# Patient Record
Sex: Male | Born: 1965 | Race: White | Hispanic: No | Marital: Single | Smoking: Current every day smoker
Health system: Southern US, Community
[De-identification: ages and names within clinical notes are randomized; demographics above are authoritative.]

## PROBLEM LIST (undated history)

## (undated) DIAGNOSIS — E119 Type 2 diabetes mellitus without complications: Secondary | ICD-10-CM

## (undated) DIAGNOSIS — I10 Essential (primary) hypertension: Secondary | ICD-10-CM

---

## 2020-11-17 ENCOUNTER — Telehealth: Payer: Self-pay

## 2020-11-17 NOTE — Telephone Encounter (Signed)
Referral received for esophageal cancer and EUS. Pathology requested from [facility]. Contacted Mr. Paras and appointment arranged for 11/19/2020 at 0915. Prior imaging includes barium swallow on 10/21/2020 in [place], ordered by his PCP. PET scan has not been performed. No availability for EUS in [place] until 06/16. Will need this to be performed at his location of choice.  Lives in [place] and is here visiting with his daughter.

## 2020-11-19 ENCOUNTER — Inpatient Hospital Stay: Payer: BLUE CROSS/BLUE SHIELD | Attending: Oncology | Admitting: Oncology

## 2020-11-19 ENCOUNTER — Encounter (INDEPENDENT_AMBULATORY_CARE_PROVIDER_SITE_OTHER): Payer: Self-pay

## 2020-11-19 ENCOUNTER — Encounter: Payer: Self-pay | Admitting: Gastroenterology

## 2020-11-19 ENCOUNTER — Inpatient Hospital Stay: Payer: BLUE CROSS/BLUE SHIELD

## 2020-11-19 ENCOUNTER — Other Ambulatory Visit: Payer: Self-pay

## 2020-11-19 VITALS — BP 130/88 | HR 81 | Temp 97.9°F | Resp 18 | Ht 69.0 in | Wt 215.7 lb

## 2020-11-19 DIAGNOSIS — C159 Malignant neoplasm of esophagus, unspecified: Secondary | ICD-10-CM | POA: Diagnosis present

## 2020-11-19 NOTE — Progress Notes (Signed)
Met with Nicholas Leblanc and his daughter, [name]. PET has been scheduled for 12/01/20. Referral sent to [facility] for EUS. No availability in [place] currently. They will contact him for scheduling. Educated further on EUS.

## 2020-11-19 NOTE — Progress Notes (Signed)
[place]  [phone] [phone]  ID: Nicholas Leblanc OB: Nov 11, 1965  MR#: [id number]  PVV#:[id number]  Patient Care Team: Nicholas Rubenstein, MD as PCP - General (Gastroenterology) Nicholas Jacks, RN as Oncology Nurse Navigator  CHIEF COMPLAINT: Esophageal adenocarcinoma.  INTERVAL HISTORY: Patient is a 55 year old male with a past several months noted increasing difficulty swallowing as well as weight loss and epigastric pain.  He came to [place] from [place] to visit his daughter stating it was too long timeframe to get medical care where he lived.  He otherwise feels well.  He has no neurologic complaints.  He denies any recent fevers or illnesses.  He denies any chest pain, shortness of breath, cough, or hemoptysis.  He denies any nausea, vomiting, constipation, or diarrhea.  He has no urinary complaints.  Patient offers no further specific complaints today.  REVIEW OF SYSTEMS:   Review of Systems  Constitutional: Positive for weight loss. Negative for fever and malaise/fatigue.  Respiratory: Negative.  Negative for cough, hemoptysis and shortness of breath.   Cardiovascular: Negative.  Negative for chest pain and leg swelling.  Gastrointestinal: Positive for abdominal pain. Negative for heartburn and nausea.  Genitourinary: Negative.  Negative for dysuria.  Musculoskeletal: Negative.  Negative for back pain.  Skin: Negative.  Negative for rash.  Neurological: Negative.  Negative for dizziness, focal weakness, weakness and headaches.  Psychiatric/Behavioral: Negative.  The patient is not nervous/anxious.     As per HPI. Otherwise, a complete review of systems is negative.  PAST MEDICAL HISTORY: No past medical history on file.  PAST SURGICAL HISTORY: No past surgical history on file.  FAMILY HISTORY: No family history on file.  ADVANCED DIRECTIVES (Y/N):  N  HEALTH MAINTENANCE:     Colonoscopy:  PAP:  Bone  density:  Lipid panel:  Not on File  Current Outpatient Medications  Medication Sig Dispense Refill  . ibuprofen (ADVIL) 800 MG tablet Take 800 mg by mouth every 8 (eight) hours as needed.    . metFORMIN (GLUCOPHAGE) 500 MG tablet Take 500 mg by mouth 2 (two) times daily with a meal.    . rOPINIRole (REQUIP) 0.5 MG tablet Take 0.5 mg by mouth 2 (two) times daily.    . simvastatin (ZOCOR) 10 MG tablet Take 10 mg by mouth daily.    . lansoprazole (PREVACID) 15 MG capsule Take 15 mg by mouth.    Marland Kitchen lisinopril (ZESTRIL) 5 MG tablet Take 5 mg by mouth daily.     No current facility-administered medications for this visit.    OBJECTIVE: Vitals:   11/19/20 0919  BP: 130/88  Pulse: 81  Resp: 18  Temp: 97.9 F (36.6 C)  SpO2: 100%     Body mass index is 31.85 kg/m.    ECOG FS:0 - Asymptomatic  General: Well-developed, well-nourished, no acute distress. Eyes: Pink conjunctiva, anicteric sclera. HEENT: Normocephalic, moist mucous membranes. Lungs: No audible wheezing or coughing. Heart: Regular rate and rhythm. Abdomen: Soft, nontender, no obvious distention. Musculoskeletal: No edema, cyanosis, or clubbing. Neuro: Alert, answering all questions appropriately. Cranial nerves grossly intact. Skin: No rashes or petechiae noted. Psych: Normal affect. Lymphatics: No cervical, calvicular, axillary or inguinal LAD.   LAB RESULTS:  No results found for: NA, K, CL, CO2, GLUCOSE, BUN, CREATININE, CALCIUM, PROT, ALBUMIN, AST, ALT, ALKPHOS, BILITOT, GFRNONAA, GFRAA  No results found for: WBC, NEUTROABS, HGB, HCT, MCV, PLT   STUDIES: No results found.  ASSESSMENT: Esophageal adenocarcinoma.  PLAN:  1. Esophageal adenocarcinoma: By report patient had EGD with biopsy confirming adenocarcinoma, but pathology results have not been reviewed.  He will require EUS and PET scan to complete the staging work-up.  Patient will follow-up 4 to 5 days after his EUS to discuss his results and  treatment planning.  I spent a total of 60 minutes reviewing chart data, face-to-face evaluation with the patient, counseling and coordination of care as detailed above.  Patient expressed understanding and was in agreement with this plan. He also understands that He can call clinic at any time with any questions, concerns, or complaints.   Cancer Staging No matching staging information was found for the patient.  Nicholas Huger, MD   11/19/2020 1:45 PM

## 2020-11-19 NOTE — Progress Notes (Signed)
Patient is here from [place] having sore chest pains and can not swallowing since the beginning of this year. Only eat like jello and apple sauce .  Tumor is blocking throat .

## 2020-11-20 ENCOUNTER — Other Ambulatory Visit: Payer: Self-pay | Admitting: *Deleted

## 2020-11-20 ENCOUNTER — Telehealth: Payer: Self-pay

## 2020-11-20 DIAGNOSIS — C159 Malignant neoplasm of esophagus, unspecified: Secondary | ICD-10-CM

## 2020-11-20 NOTE — Telephone Encounter (Signed)
Called and notified daughter, [name], with appointments 6/14 starting at 0930 with Dr. Baruch Gouty. He will see Dr. Grayland Ormond following at 1015.

## 2020-11-26 ENCOUNTER — Other Ambulatory Visit: Payer: Self-pay | Admitting: *Deleted

## 2020-11-26 DIAGNOSIS — C159 Malignant neoplasm of esophagus, unspecified: Secondary | ICD-10-CM

## 2020-11-27 ENCOUNTER — Telehealth: Payer: Self-pay

## 2020-11-27 NOTE — Telephone Encounter (Signed)
CT scan authorized and rescheduled for 12/01/20 at 0830 with [place] arrival. Called and updated daughter, [name]. Reviewed instructions as written. Directions given to the [facility].

## 2020-12-01 ENCOUNTER — Ambulatory Visit
Admission: RE | Admit: 2020-12-01 | Discharge: 2020-12-01 | Disposition: A | Discharge status: Home / Self Care | Payer: BLUE CROSS/BLUE SHIELD | Source: Ambulatory Visit | Attending: Oncology | Admitting: Oncology

## 2020-12-01 ENCOUNTER — Other Ambulatory Visit: Payer: Self-pay

## 2020-12-01 ENCOUNTER — Ambulatory Visit: Payer: BLUE CROSS/BLUE SHIELD

## 2020-12-01 DIAGNOSIS — C159 Malignant neoplasm of esophagus, unspecified: Secondary | ICD-10-CM | POA: Diagnosis not present

## 2020-12-01 HISTORY — DX: Type 2 diabetes mellitus without complications: E11.9

## 2020-12-01 HISTORY — DX: Essential (primary) hypertension: I10

## 2020-12-01 LAB — POCT I-STAT CREATININE: Creatinine, Ser: 0.8 mg/dL (ref 0.61–1.24)

## 2020-12-01 MED ORDER — IOHEXOL 300 MG/ML  SOLN
100.0000 mL | Freq: Once | INTRAMUSCULAR | Status: AC | PRN
  Administered 2020-12-01: 100 mL via INTRAVENOUS

## 2020-12-03 ENCOUNTER — Encounter: Payer: Self-pay | Admitting: Oncology

## 2020-12-03 ENCOUNTER — Ambulatory Visit: Payer: BLUE CROSS/BLUE SHIELD

## 2020-12-04 DIAGNOSIS — C159 Malignant neoplasm of esophagus, unspecified: Secondary | ICD-10-CM | POA: Insufficient documentation

## 2020-12-04 NOTE — Progress Notes (Signed)
[place]  [phone]) [phone] Fax:(336) [phone]  ID: Nicholas Leblanc OB: 06-Jan-1966  MR#: [id number]  NLZ#:[id number]  Patient Care Team: Lesly Rubenstein, MD as PCP - General (Gastroenterology) Clent Jacks, RN as Oncology Nurse Navigator  CHIEF COMPLAINT: Stage III esophageal adenocarcinoma.  INTERVAL HISTORY: Patient returns to clinic today for further evaluation, discussion of his pathology and imaging results, and treatment planning.  He continues to have difficulty swallowing, but otherwise feels well. He has no neurologic complaints.  He denies any recent fevers or illnesses.  He denies any chest pain, shortness of breath, cough, or hemoptysis.  He denies any nausea, vomiting, constipation, or diarrhea.  He has no urinary complaints.  Patient offers no further specific complaints today.  REVIEW OF SYSTEMS:   Review of Systems  Constitutional:  Positive for weight loss. Negative for fever and malaise/fatigue.  Respiratory: Negative.  Negative for cough, hemoptysis and shortness of breath.   Cardiovascular: Negative.  Negative for chest pain and leg swelling.  Gastrointestinal:  Negative for abdominal pain, heartburn and nausea.  Genitourinary: Negative.  Negative for dysuria.  Musculoskeletal:  Positive for back pain.  Skin: Negative.  Negative for rash.  Neurological: Negative.  Negative for dizziness, focal weakness, weakness and headaches.  Psychiatric/Behavioral: Negative.  The patient is not nervous/anxious.    As per HPI. Otherwise, a complete review of systems is negative.  PAST MEDICAL HISTORY: Past Medical History:  Diagnosis Date   Diabetes mellitus without complication ([place])    Hypertension     PAST SURGICAL HISTORY: No past surgical history on file.  FAMILY HISTORY: History reviewed. No pertinent family history.  ADVANCED DIRECTIVES (Y/N):  N  HEALTH MAINTENANCE: Social History   Tobacco Use   Smoking status: Every Day     Packs/day: 1.00    Pack years: 0.00    Types: Cigarettes   Smokeless tobacco: Never  Vaping Use   Vaping Use: Every day  Substance Use Topics   Alcohol use: Never   Drug use: Yes    Types: Marijuana     Colonoscopy:  PAP:  Bone density:  Lipid panel:  Allergies  Allergen Reactions   Bee Venom Swelling    Doesn't require Epi-Pen    Current Outpatient Medications  Medication Sig Dispense Refill   ibuprofen (ADVIL) 800 MG tablet Take 800 mg by mouth every 8 (eight) hours as needed.     lisinopril (ZESTRIL) 5 MG tablet Take 5 mg by mouth daily.     metFORMIN (GLUCOPHAGE) 500 MG tablet Take 500 mg by mouth 2 (two) times daily with a meal.     omeprazole (PRILOSEC) 40 MG capsule Take 1 capsule by mouth daily.     simvastatin (ZOCOR) 10 MG tablet Take 10 mg by mouth daily.     ondansetron (ZOFRAN) 8 MG tablet Take 1 tablet (8 mg total) by mouth 2 (two) times daily as needed for refractory nausea / vomiting. Start on day 3 after chemo. 60 tablet 1   No current facility-administered medications for this visit.    OBJECTIVE: There were no vitals filed for this visit.    There is no height or weight on file to calculate BMI.    ECOG FS:0 - Asymptomatic  General: Well-developed, well-nourished, no acute distress. Eyes: Pink conjunctiva, anicteric sclera. HEENT: Normocephalic, moist mucous membranes. Lungs: No audible wheezing or coughing. Heart: Regular rate and rhythm. Abdomen: Soft, nontender, no obvious distention. Musculoskeletal: No edema, cyanosis, or clubbing. Neuro: Alert, answering all  questions appropriately. Cranial nerves grossly intact. Skin: No rashes or petechiae noted. Psych: Normal affect.   LAB RESULTS:  Lab Results  Component Value Date   CREATININE 0.80 12/01/2020    No results found for: WBC, NEUTROABS, HGB, HCT, MCV, PLT   STUDIES: CT CHEST ABDOMEN PELVIS W CONTRAST  Result Date: 12/01/2020 CLINICAL DATA:  Esophageal cancer. Worsening  dysphagia with intermittent nausea and vomiting. Current smoker. EXAM: CT CHEST, ABDOMEN, AND PELVIS WITH CONTRAST TECHNIQUE: Multidetector CT imaging of the chest, abdomen and pelvis was performed following the standard protocol during bolus administration of intravenous contrast. CONTRAST:  178mL OMNIPAQUE IOHEXOL 300 MG/ML  SOLN COMPARISON:  None. FINDINGS: CT CHEST FINDINGS Cardiovascular: Heart is at the upper limits of normal in size. No pericardial effusion. Mediastinum/Nodes: Mid periesophageal lymph node measures 6 mm (2/29). 8 mm short axis distal periesophageal lymph node is seen adjacent to masslike thickening of the distal esophagus and gastroesophageal junction. No additional pathologically enlarged mediastinal, hilar or axillary lymph nodes. Lungs/Pleura: Smoking related respiratory bronchiolitis. 3 mm anterior segment central right upper lobe nodule (3/77). Scarring in the lingula. Lungs are otherwise clear. No pleural fluid. Airway is unremarkable. Musculoskeletal: Old posterior right rib fracture. Degenerative changes in the spine. No worrisome lytic or sclerotic lesions. CT ABDOMEN PELVIS FINDINGS Hepatobiliary: Liver is slightly decreased in attenuation diffusely. Liver and gallbladder are otherwise unremarkable. No biliary ductal dilatation. Pancreas: Negative. Spleen: Negative. Adrenals/Urinary Tract: Adrenal glands and right kidney are unremarkable. Probable small renal sinus cysts on the left. Ureters are decompressed. Slight bladder wall thickening. Stomach/Bowel: Masslike thickening at the gastroesophageal junction. Stomach, small bowel, appendix and colon are otherwise unremarkable. Vascular/Lymphatic: Vascular structures are unremarkable. Gastrohepatic ligament lymph nodes measure up to 12 mm (2/54). Left periaortic lymph node measures 6 mm (2/64). Reproductive: Prostate is visualized. Other: No free fluid. Mesenteries and peritoneum are otherwise unremarkable. Musculoskeletal:  Degenerative changes in the spine. No worrisome lytic or sclerotic lesions. IMPRESSION: 1. Masslike gastroesophageal and distal esophageal wall thickening, compatible with the provided history of esophageal carcinoma. Small to mildly enlarged periesophageal, gastrohepatic ligament and high left periaortic lymph nodes. No evidence of distant metastatic disease. 2. 3 mm right upper lobe nodule, nonspecific. Recommend attention on follow-up. 3. Hepatic steatosis. Electronically Signed   By: Lorin Picket M.D.   On: 12/01/2020 12:31    ASSESSMENT: Stage III esophageal adenocarcinoma.  PLAN:    1. Stage III esophageal adenocarcinoma: EGD and biopsy completed in [place] confirms adenocarcinoma.  Recent EUS and CT scan confirmed stage of disease.  He had consultation with radiation oncology earlier today.  Patient will benefit from neoadjuvant chemotherapy with weekly carboplatinum and Taxol along with daily XRT.  Rad Julio Alm has ordered PET scan for treatment planning purposes.  Patient also wishes to go back to [place] for 1 to 2 weeks prior to initiating treatment.  Return to clinic in 2 to 3 weeks for further evaluation and consideration of cycle 1 of weekly carboplatinum and Taxol and daily XRT.   2.  Back pain: Chronic.  Patient plans to discuss treatment options with his primary care physician in [place].  I spent a total of 30 minutes reviewing chart data, face-to-face evaluation with the patient, counseling and coordination of care as detailed above.  Patient expressed understanding and was in agreement with this plan. He also understands that He can call clinic at any time with any questions, concerns, or complaints.   Cancer Staging Esophageal adenocarcinoma [facility]) Staging form: Esophagus -  Adenocarcinoma, AJCC 8th Edition - Clinical stage from 12/04/2020: Stage III (cT3, cN1, cM0) - Signed by [name], MD on 12/04/2020 Stage prefix: Initial diagnosis  [name], MD    12/08/2020 2:03 PM

## 2020-12-08 ENCOUNTER — Encounter: Payer: Self-pay | Admitting: Radiation Oncology

## 2020-12-08 ENCOUNTER — Inpatient Hospital Stay: Payer: BLUE CROSS/BLUE SHIELD | Attending: Oncology | Admitting: Oncology

## 2020-12-08 ENCOUNTER — Encounter: Payer: Self-pay | Admitting: Oncology

## 2020-12-08 ENCOUNTER — Ambulatory Visit
Admission: RE | Admit: 2020-12-08 | Discharge: 2020-12-08 | Disposition: A | Discharge status: Home / Self Care | Payer: BLUE CROSS/BLUE SHIELD | Source: Ambulatory Visit | Attending: Radiation Oncology | Admitting: Radiation Oncology

## 2020-12-08 VITALS — BP 146/87 | HR 98 | Temp 96.1°F | Resp 18 | Wt 201.8 lb

## 2020-12-08 DIAGNOSIS — G8929 Other chronic pain: Secondary | ICD-10-CM | POA: Insufficient documentation

## 2020-12-08 DIAGNOSIS — M549 Dorsalgia, unspecified: Secondary | ICD-10-CM | POA: Insufficient documentation

## 2020-12-08 DIAGNOSIS — C155 Malignant neoplasm of lower third of esophagus: Secondary | ICD-10-CM | POA: Diagnosis present

## 2020-12-08 DIAGNOSIS — C159 Malignant neoplasm of esophagus, unspecified: Secondary | ICD-10-CM | POA: Diagnosis present

## 2020-12-08 DIAGNOSIS — F17210 Nicotine dependence, cigarettes, uncomplicated: Secondary | ICD-10-CM | POA: Insufficient documentation

## 2020-12-08 DIAGNOSIS — Z79899 Other long term (current) drug therapy: Secondary | ICD-10-CM | POA: Diagnosis not present

## 2020-12-08 DIAGNOSIS — Z7984 Long term (current) use of oral hypoglycemic drugs: Secondary | ICD-10-CM | POA: Insufficient documentation

## 2020-12-08 DIAGNOSIS — I10 Essential (primary) hypertension: Secondary | ICD-10-CM | POA: Diagnosis not present

## 2020-12-08 DIAGNOSIS — E119 Type 2 diabetes mellitus without complications: Secondary | ICD-10-CM | POA: Diagnosis not present

## 2020-12-08 DIAGNOSIS — R634 Abnormal weight loss: Secondary | ICD-10-CM | POA: Diagnosis not present

## 2020-12-08 MED ORDER — ONDANSETRON HCL 8 MG PO TABS
8.0000 mg | ORAL_TABLET | Freq: Two times a day (BID) | ORAL | 1 refills | Status: DC | PRN
Start: 2020-12-08 — End: 2024-03-04

## 2020-12-08 NOTE — Progress Notes (Signed)
Pt seen in radiation oncology by Dr Baruch Gouty. Vital signs obtained and pt medical history reviewed in radiation clinic.

## 2020-12-08 NOTE — Consult Note (Signed)
NEW PATIENT EVALUATION  Name: Nicholas Leblanc  MRN: [id number]  Date:   12/08/2020     DOB: 28-Jun-1965   This 55 y.o. male patient presents to the clinic for initial evaluation of locally advanced adenocarcinoma the distal esophagus pending PET/CT for complete staging.  REFERRING PHYSICIAN: Lesly Rubenstein, MD  CHIEF COMPLAINT:  Chief Complaint  Patient presents with   Esophageal Cancer    Initial consultation    DIAGNOSIS: The encounter diagnosis was Esophageal adenocarcinoma ([place]).   PREVIOUS INVESTIGATIONS:  CT scan reviewed Pathology report reviewed Clinical notes reviewed  HPI: Patient is a 55 year old male with an 80 pound weight loss since January and increasing dysphagia.  He is from [place] was down here [place].  He underwent upper and endoscopy showing a mass in the distal esophagus biopsy positive for moderately differentiated adenocarcinoma.  He had a CT scan performed showing masslike GE junction and distal esophageal wall thickening compatible with history of esophageal carcinoma.  He also has small to mildly enlarged periesophageal gastrohepatic ligament and high left periaortic lymph nodes.  No evidence of distant metastatic disease.  He has PET scan pending.  He has been seen by medical oncology is now referred to radiation oncology for consideration of treatment.  Continues to have some dysphagia.  Also has chronic back pain.  PLANNED TREATMENT REGIMEN: Concurrent chemoradiation in neoadjuvant fashion in anticipation of possible surgical resection  PAST MEDICAL HISTORY:  has a past medical history of Diabetes mellitus without complication ([place]) and Hypertension.    PAST SURGICAL HISTORY: History reviewed. No pertinent surgical history.  FAMILY HISTORY: family history is not on file.  SOCIAL HISTORY:  reports that he has been smoking cigarettes. He has been smoking an average of 1.00 packs per day. He has never used smokeless tobacco. He reports  current drug use. Drug: Marijuana. He reports that he does not drink alcohol.  ALLERGIES: Bee venom  MEDICATIONS:  Current Outpatient Medications  Medication Sig Dispense Refill   ibuprofen (ADVIL) 800 MG tablet Take 800 mg by mouth every 8 (eight) hours as needed.     lisinopril (ZESTRIL) 5 MG tablet Take 5 mg by mouth daily.     metFORMIN (GLUCOPHAGE) 500 MG tablet Take 500 mg by mouth 2 (two) times daily with a meal.     omeprazole (PRILOSEC) 40 MG capsule Take 1 capsule by mouth daily.     ondansetron (ZOFRAN) 8 MG tablet Take 1 tablet (8 mg total) by mouth 2 (two) times daily as needed for refractory nausea / vomiting. Start on day 3 after chemo. 60 tablet 1   simvastatin (ZOCOR) 10 MG tablet Take 10 mg by mouth daily.     No current facility-administered medications for this encounter.    ECOG PERFORMANCE STATUS:  1 - Symptomatic but completely ambulatory  REVIEW OF SYSTEMS: Patient denies any weight loss, fatigue, weakness, fever, chills or night sweats. Patient denies any loss of vision, blurred vision. Patient denies any ringing  of the ears or hearing loss. No irregular heartbeat. Patient denies heart murmur or history of fainting. Patient denies any chest pain or pain radiating to her upper extremities. Patient denies any shortness of breath, difficulty breathing at night, cough or hemoptysis. Patient denies any swelling in the lower legs. Patient denies any nausea vomiting, vomiting of blood, or coffee ground material in the vomitus. Patient denies any stomach pain. Patient states has had normal bowel movements no significant constipation or diarrhea. Patient denies any dysuria, hematuria  or significant nocturia. Patient denies any problems walking, swelling in the joints or loss of balance. Patient denies any skin changes, loss of hair or loss of weight. Patient denies any excessive worrying or anxiety or significant depression. Patient denies any problems with insomnia. Patient  denies excessive thirst, polyuria, polydipsia. Patient denies any swollen glands, patient denies easy bruising or easy bleeding. Patient denies any recent infections, allergies or URI. Patient "s visual fields have not changed significantly in recent time.   PHYSICAL EXAM: BP (!) 146/87 (BP Location: Left Arm, Patient Position: Sitting)   Pulse 98   Temp (!) 96.1 F (35.6 C) (Tympanic)   Resp 18   Wt 201 lb 12.8 oz (91.5 kg)   BMI 29.80 kg/m  Well-developed well-nourished patient in NAD. HEENT reveals PERLA, EOMI, discs not visualized.  Oral cavity is clear. No oral mucosal lesions are identified. Neck is clear without evidence of cervical or supraclavicular adenopathy. Lungs are clear to A&P. Cardiac examination is essentially unremarkable with regular rate and rhythm without murmur rub or thrill. Abdomen is benign with no organomegaly or masses noted. Motor sensory and DTR levels are equal and symmetric in the upper and lower extremities. Cranial nerves II through XII are grossly intact. Proprioception is intact. No peripheral adenopathy or edema is identified. No motor or sensory levels are noted. Crude visual fields are within normal range.  LABORATORY DATA: Pathology report reviewed    RADIOLOGY RESULTS: CT scans chest and abdomen reviewed compatible with above-stated findings   IMPRESSION: Locally advanced adenocarcinoma the distal esophagus in 55 year old male  PLAN: This time I am waiting the PET/CT results for complete staging.  I would plan on concurrent chemoradiation therapy in a neoadjuvant fashion to his distal esophagus.  I would use IMRT treatment planning and delivery to spare critical structures such as his spinal cord stomach heart and lungs.  Risks and benefits of treatment including increased dysphagia polyps possible skin reaction alteration of blood counts and fatigue all were reviewed with the patient and his daughter.  They both seem to comprehend my treatment plan well.   I will schedule CT simulation when we have final definitive scheduling of his PET CT scan.  There will be extra effort by both professional staff as well as technical staff to coordinate and manage concurrent chemoradiation and ensuing side effects during his treatments.   I would like to take this opportunity to thank you for allowing me to participate in the care of your patient.Noreene Filbert, MD

## 2020-12-08 NOTE — Progress Notes (Signed)
START ON PATHWAY REGIMEN - Gastroesophageal     Administer weekly during RT:     Paclitaxel      Carboplatin   **Always confirm dose/schedule in your pharmacy ordering system**  Patient Characteristics: Esophageal & GE Junction, Adenocarcinoma, Preoperative or Nonsurgical Candidate (Clinical Staging), cT2 or Higher or cN+, Surgical Candidate (Up to cT4a) - Preoperative Therapy, Esophageal Histology: Adenocarcinoma Disease Classification: Esophageal Therapeutic Status: Preoperative or Nonsurgical Candidate (Clinical Staging) AJCC Grade: GX AJCC 8 Stage Grouping: III AJCC T Category: cT3 AJCC N Category: cN1 AJCC M Category: cM0 Intent of Therapy: Curative Intent, Discussed with Patient

## 2020-12-09 ENCOUNTER — Ambulatory Visit: Payer: BLUE CROSS/BLUE SHIELD

## 2020-12-10 ENCOUNTER — Other Ambulatory Visit: Payer: BLUE CROSS/BLUE SHIELD

## 2020-12-11 ENCOUNTER — Other Ambulatory Visit: Payer: Self-pay | Admitting: Pharmacist

## 2020-12-29 ENCOUNTER — Inpatient Hospital Stay: Payer: BLUE CROSS/BLUE SHIELD

## 2020-12-31 ENCOUNTER — Ambulatory Visit
Admission: RE | Admit: 2020-12-31 | Discharge: 2020-12-31 | Disposition: A | Discharge status: Home / Self Care | Payer: BLUE CROSS/BLUE SHIELD | Source: Ambulatory Visit | Attending: Oncology | Admitting: Oncology

## 2020-12-31 ENCOUNTER — Other Ambulatory Visit: Payer: Self-pay

## 2020-12-31 DIAGNOSIS — C159 Malignant neoplasm of esophagus, unspecified: Secondary | ICD-10-CM | POA: Diagnosis present

## 2020-12-31 LAB — GLUCOSE, CAPILLARY: Glucose-Capillary: 150 mg/dL — ABNORMAL HIGH (ref 70–99)

## 2020-12-31 MED ORDER — FLUDEOXYGLUCOSE F - 18 (FDG) INJECTION
9.5000 | Freq: Once | INTRAVENOUS | Status: AC | PRN
  Administered 2020-12-31: 10.03 via INTRAVENOUS

## 2021-01-01 ENCOUNTER — Inpatient Hospital Stay: Payer: BLUE CROSS/BLUE SHIELD | Attending: Oncology

## 2021-01-01 DIAGNOSIS — Z5111 Encounter for antineoplastic chemotherapy: Secondary | ICD-10-CM | POA: Insufficient documentation

## 2021-01-01 DIAGNOSIS — M549 Dorsalgia, unspecified: Secondary | ICD-10-CM | POA: Insufficient documentation

## 2021-01-01 DIAGNOSIS — C159 Malignant neoplasm of esophagus, unspecified: Secondary | ICD-10-CM | POA: Insufficient documentation

## 2021-01-01 DIAGNOSIS — R634 Abnormal weight loss: Secondary | ICD-10-CM | POA: Insufficient documentation

## 2021-01-01 DIAGNOSIS — G8929 Other chronic pain: Secondary | ICD-10-CM | POA: Insufficient documentation

## 2021-01-01 DIAGNOSIS — Z79899 Other long term (current) drug therapy: Secondary | ICD-10-CM | POA: Insufficient documentation

## 2021-01-04 ENCOUNTER — Ambulatory Visit
Admission: RE | Admit: 2021-01-04 | Discharge: 2021-01-04 | Disposition: A | Discharge status: Home / Self Care | Payer: BLUE CROSS/BLUE SHIELD | Source: Ambulatory Visit | Attending: Radiation Oncology | Admitting: Radiation Oncology

## 2021-01-04 DIAGNOSIS — C155 Malignant neoplasm of lower third of esophagus: Secondary | ICD-10-CM | POA: Diagnosis present

## 2021-01-04 DIAGNOSIS — Z510 Encounter for antineoplastic radiation therapy: Secondary | ICD-10-CM | POA: Diagnosis present

## 2021-01-06 ENCOUNTER — Telehealth: Payer: Self-pay

## 2021-01-06 NOTE — Telephone Encounter (Signed)
Patient is scheduled to start radiation on 7/19.  Please schedule lab/Finn/Carbo/Taxol (weekly) *NEW* no later than 7/21.

## 2021-01-06 NOTE — Progress Notes (Deleted)
Pharmacist Chemotherapy Monitoring - Initial Assessment    Anticipated start date: 01/13/21   The following has been reviewed per standard work regarding the patient's treatment regimen: The patient's diagnosis, treatment plan and drug doses, and organ/hematologic function Lab orders and baseline tests specific to treatment regimen  The treatment plan start date, drug sequencing, and pre-medications Prior authorization status  Patient's documented medication list, including drug-drug interaction screen and prescriptions for anti-emetics and supportive care specific to the treatment regimen The drug concentrations, fluid compatibility, administration routes, and timing of the medications to be used The patient's access for treatment and lifetime cumulative dose history, if applicable  The patient's medication allergies and previous infusion related reactions, if applicable   Changes made to treatment plan:  treatment plan date  Follow up needed:  {MD follow up:25720}   Adelina Mings, RPH, 01/06/2021  1:22 PM

## 2021-01-10 DIAGNOSIS — C155 Malignant neoplasm of lower third of esophagus: Secondary | ICD-10-CM | POA: Diagnosis not present

## 2021-01-12 ENCOUNTER — Ambulatory Visit: Admission: RE | Admit: 2021-01-12 | Payer: BLUE CROSS/BLUE SHIELD | Source: Ambulatory Visit

## 2021-01-12 NOTE — Progress Notes (Signed)
[place]  [phone] [phone]) [phone]  ID: Nicholas Leblanc OB: 11-Aug-1965  MR#: [id number]  HFW#:[id number]  Patient Care Team: Lesly Rubenstein, MD as PCP - General (Gastroenterology) Clent Jacks, RN as Oncology Nurse Navigator  CHIEF COMPLAINT: Stage III esophageal adenocarcinoma.  INTERVAL HISTORY: Patient returns to clinic today for further evaluation and initiation of concurrent chemotherapy with weekly carboplatinum and Taxol along with his daily XRT.  He continues to have difficulty swallowing that is unchanged. He has no neurologic complaints.  He denies any recent fevers or illnesses.  He denies any chest pain, shortness of breath, cough, or hemoptysis.  He denies any nausea, vomiting, constipation, or diarrhea.  He has no urinary complaints.  Patient offers no further specific complaints today.  REVIEW OF SYSTEMS:   Review of Systems  Constitutional: Negative.  Negative for fever, malaise/fatigue and weight loss.  Respiratory: Negative.  Negative for cough, hemoptysis and shortness of breath.   Cardiovascular: Negative.  Negative for chest pain and leg swelling.  Gastrointestinal:  Negative for abdominal pain, heartburn and nausea.  Genitourinary: Negative.  Negative for dysuria.  Musculoskeletal:  Positive for back pain.  Skin: Negative.  Negative for rash.  Neurological: Negative.  Negative for dizziness, focal weakness, weakness and headaches.  Psychiatric/Behavioral: Negative.  The patient is not nervous/anxious.    As per HPI. Otherwise, a complete review of systems is negative.  PAST MEDICAL HISTORY: Past Medical History:  Diagnosis Date   Diabetes mellitus without complication ([place])    Hypertension     PAST SURGICAL HISTORY: No past surgical history on file.  FAMILY HISTORY: History reviewed. No pertinent family history.  ADVANCED DIRECTIVES (Y/N):  N  HEALTH MAINTENANCE: Social History   Tobacco Use    Smoking status: Every Day    Packs/day: 1.00    Types: Cigarettes   Smokeless tobacco: Never  Vaping Use   Vaping Use: Every day  Substance Use Topics   Alcohol use: Never   Drug use: Yes    Types: Marijuana     Colonoscopy:  PAP:  Bone density:  Lipid panel:  Allergies  Allergen Reactions   Bee Venom Swelling    Doesn't require Epi-Pen    Current Outpatient Medications  Medication Sig Dispense Refill   FLUoxetine (PROZAC) 20 MG capsule Take 20 mg by mouth daily.     HYDROmorphone (DILAUDID) 8 MG tablet Take by mouth.     ibuprofen (ADVIL) 800 MG tablet Take 800 mg by mouth every 8 (eight) hours as needed.     lisinopril (ZESTRIL) 5 MG tablet Take 5 mg by mouth daily.     metFORMIN (GLUCOPHAGE) 500 MG tablet Take 500 mg by mouth 2 (two) times daily with a meal.     omeprazole (PRILOSEC) 40 MG capsule Take 1 capsule by mouth daily.     ondansetron (ZOFRAN) 8 MG tablet Take 1 tablet (8 mg total) by mouth 2 (two) times daily as needed for refractory nausea / vomiting. Start on day 3 after chemo. 60 tablet 1   oxyCODONE-acetaminophen (PERCOCET) 10-325 MG tablet Take 1 tablet by mouth every 4 (four) hours as needed.     prochlorperazine (COMPAZINE) 10 MG tablet Take 1 tablet (10 mg total) by mouth every 6 (six) hours as needed for nausea or vomiting. 30 tablet 2   simvastatin (ZOCOR) 10 MG tablet Take 10 mg by mouth daily.     No current facility-administered medications for this visit.    OBJECTIVE:  Vitals:   01/13/21 0851  BP: 113/87  Pulse: 97  Resp: 18  Temp: (!) 96 F (35.6 C)  SpO2: 100%      Body mass index is 26.43 kg/m.    ECOG FS:0 - Asymptomatic  General: Well-developed, well-nourished, no acute distress. Eyes: Pink conjunctiva, anicteric sclera. HEENT: Normocephalic, moist mucous membranes. Lungs: No audible wheezing or coughing. Heart: Regular rate and rhythm. Abdomen: Soft, nontender, no obvious distention. Musculoskeletal: No edema, cyanosis, or  clubbing. Neuro: Alert, answering all questions appropriately. Cranial nerves grossly intact. Skin: No rashes or petechiae noted. Psych: Normal affect.   LAB RESULTS:  Lab Results  Component Value Date   NA 139 01/13/2021   K 4.5 01/13/2021   CL 99 01/13/2021   CO2 27 01/13/2021   GLUCOSE 135 (H) 01/13/2021   BUN 14 01/13/2021   CREATININE 0.69 01/13/2021   CALCIUM 9.2 01/13/2021   PROT 6.9 01/13/2021   ALBUMIN 4.3 01/13/2021   AST 24 01/13/2021   ALT 20 01/13/2021   ALKPHOS 78 01/13/2021   BILITOT 1.1 01/13/2021   GFRNONAA >60 01/13/2021    Lab Results  Component Value Date   WBC 5.0 01/13/2021   NEUTROABS 3.1 01/13/2021   HGB 15.3 01/13/2021   HCT 43.7 01/13/2021   MCV 86.9 01/13/2021   PLT 222 01/13/2021     STUDIES: NM PET Image Initial (PI) Skull Base To Thigh  Result Date: 12/31/2020 CLINICAL DATA:  Initial treatment strategy for esophageal cancer. EXAM: NUCLEAR MEDICINE PET SKULL BASE TO THIGH TECHNIQUE: 10.03 mCi F-18 FDG was injected intravenously. Full-ring PET imaging was performed from the skull base to thigh after the radiotracer. CT data was obtained and used for attenuation correction and anatomic localization. Fasting blood glucose: 150 mg/dl COMPARISON:  Chest CT 12/01/2020 FINDINGS: Mediastinal blood pool activity: SUV max 2.50 Liver activity: SUV max NA NECK: No hypermetabolic lymph nodes in the neck. Incidental CT findings: none CHEST: Distal esophageal mass is hypermetabolic and extends into the GE junction. SUV max is 22.04. No paraesophageal lymphadenopathy and no enlarged or hypermetabolic mediastinal or hilar lymph nodes. No supraclavicular or axillary adenopathy. No worrisome pulmonary nodules are identified the CT scan. Incidental CT findings: none ABDOMEN/PELVIS: No enlarged or hypermetabolic gastrohepatic ligament, celiac axis or retroperitoneal lymph nodes. No findings suspicious for hepatic metastatic disease. The adrenal glands are normal.  Incidental CT findings: none SKELETON: Incidental CT findings: No findings suspicious for osseous metastatic disease. IMPRESSION: 1. Hypermetabolic distal esophageal mass extending into the GE junction consistent with known esophageal cancer. 2. No findings for locoregional hypermetabolic lymphadenopathy or distant metastatic disease. Electronically Signed   By: Marijo Sanes M.D.   On: 12/31/2020 21:23    ASSESSMENT: Stage III esophageal adenocarcinoma.  PLAN:    1. Stage III esophageal adenocarcinoma: EGD and biopsy completed in [place] confirms adenocarcinoma.  Recent EUS and CT scan confirmed stage of disease.  PET scan results from December 31, 2020 reviewed independently and reported as above with hypermetabolic distal esophageal mass, without local regional lymphadenopathy.  Patient will benefit from neoadjuvant chemotherapy with weekly carboplatinum and Taxol along with daily XRT.  This will be followed by partial esophagectomy at [facility].  Patient will initiate XRT today.  Proceed with cycle 1 of weekly carboplatinum and Taxol.  Return to clinic in 1 week for further evaluation and consideration of cycle 2. 2.  Back pain: Chronic and unchanged.  I spent a total of 30 minutes reviewing chart data, face-to-face evaluation with  the patient, counseling and coordination of care as detailed above.   Patient expressed understanding and was in agreement with this plan. He also understands that He can call clinic at any time with any questions, concerns, or complaints.   Cancer Staging Esophageal adenocarcinoma [facility]) Staging form: Esophagus - Adenocarcinoma, AJCC 8th Edition - Clinical stage from 12/04/2020: Stage III (cT3, cN1, cM0) - Signed by [name], MD on 12/04/2020 Stage prefix: Initial diagnosis  [name], MD   01/15/2021 1:21 PM

## 2021-01-13 ENCOUNTER — Encounter: Payer: Self-pay | Admitting: Oncology

## 2021-01-13 ENCOUNTER — Inpatient Hospital Stay: Payer: BLUE CROSS/BLUE SHIELD | Admitting: Oncology

## 2021-01-13 ENCOUNTER — Inpatient Hospital Stay: Payer: BLUE CROSS/BLUE SHIELD

## 2021-01-13 ENCOUNTER — Ambulatory Visit
Admission: RE | Admit: 2021-01-13 | Discharge: 2021-01-13 | Disposition: A | Discharge status: Home / Self Care | Payer: BLUE CROSS/BLUE SHIELD | Source: Ambulatory Visit | Attending: Radiation Oncology | Admitting: Radiation Oncology

## 2021-01-13 VITALS — BP 116/76 | HR 65 | Temp 97.0°F | Resp 16

## 2021-01-13 VITALS — BP 113/87 | HR 97 | Temp 96.0°F | Resp 18 | Wt 179.0 lb

## 2021-01-13 DIAGNOSIS — C159 Malignant neoplasm of esophagus, unspecified: Secondary | ICD-10-CM

## 2021-01-13 DIAGNOSIS — R634 Abnormal weight loss: Secondary | ICD-10-CM | POA: Diagnosis not present

## 2021-01-13 DIAGNOSIS — M549 Dorsalgia, unspecified: Secondary | ICD-10-CM | POA: Diagnosis not present

## 2021-01-13 DIAGNOSIS — C155 Malignant neoplasm of lower third of esophagus: Secondary | ICD-10-CM | POA: Diagnosis not present

## 2021-01-13 DIAGNOSIS — Z79899 Other long term (current) drug therapy: Secondary | ICD-10-CM | POA: Diagnosis not present

## 2021-01-13 DIAGNOSIS — G8929 Other chronic pain: Secondary | ICD-10-CM | POA: Diagnosis not present

## 2021-01-13 DIAGNOSIS — Z5111 Encounter for antineoplastic chemotherapy: Secondary | ICD-10-CM | POA: Diagnosis present

## 2021-01-13 LAB — CBC WITH DIFFERENTIAL/PLATELET
Abs Immature Granulocytes: 0.01 10*3/uL (ref 0.00–0.07)
Basophils Absolute: 0.1 10*3/uL (ref 0.0–0.1)
Basophils Relative: 1 %
Eosinophils Absolute: 0.2 10*3/uL (ref 0.0–0.5)
Eosinophils Relative: 4 %
HCT: 43.7 % (ref 39.0–52.0)
Hemoglobin: 15.3 g/dL (ref 13.0–17.0)
Immature Granulocytes: 0 %
Lymphocytes Relative: 23 %
Lymphs Abs: 1.1 10*3/uL (ref 0.7–4.0)
MCH: 30.4 pg (ref 26.0–34.0)
MCHC: 35 g/dL (ref 30.0–36.0)
MCV: 86.9 fL (ref 80.0–100.0)
Monocytes Absolute: 0.6 10*3/uL (ref 0.1–1.0)
Monocytes Relative: 11 %
Neutro Abs: 3.1 10*3/uL (ref 1.7–7.7)
Neutrophils Relative %: 61 %
Platelets: 222 10*3/uL (ref 150–400)
RBC: 5.03 MIL/uL (ref 4.22–5.81)
RDW: 12 % (ref 11.5–15.5)
WBC: 5 10*3/uL (ref 4.0–10.5)
nRBC: 0 % (ref 0.0–0.2)

## 2021-01-13 LAB — COMPREHENSIVE METABOLIC PANEL
ALT: 20 U/L (ref 0–44)
AST: 24 U/L (ref 15–41)
Albumin: 4.3 g/dL (ref 3.5–5.0)
Alkaline Phosphatase: 78 U/L (ref 38–126)
Anion gap: 13 (ref 5–15)
BUN: 14 mg/dL (ref 6–20)
CO2: 27 mmol/L (ref 22–32)
Calcium: 9.2 mg/dL (ref 8.9–10.3)
Chloride: 99 mmol/L (ref 98–111)
Creatinine, Ser: 0.69 mg/dL (ref 0.61–1.24)
GFR, Estimated: >60 mL/min (ref >60)
Glucose, Bld: 135 mg/dL — ABNORMAL HIGH (ref 70–99)
Potassium: 4.5 mmol/L (ref 3.5–5.1)
Sodium: 139 mmol/L (ref 135–145)
Total Bilirubin: 1.1 mg/dL (ref 0.3–1.2)
Total Protein: 6.9 g/dL (ref 6.5–8.1)

## 2021-01-13 MED ORDER — PALONOSETRON HCL INJECTION 0.25 MG/5ML
0.2500 mg | Freq: Once | INTRAVENOUS | Status: AC
  Administered 2021-01-13: 0.25 mg via INTRAVENOUS
  Filled 2021-01-13: qty 5

## 2021-01-13 MED ORDER — SODIUM CHLORIDE 0.9 % IV SOLN
50.0000 mg/m2 | Freq: Once | INTRAVENOUS | Status: AC
  Administered 2021-01-13: 108 mg via INTRAVENOUS
  Filled 2021-01-13: qty 18

## 2021-01-13 MED ORDER — SODIUM CHLORIDE 0.9 % IV SOLN
300.0000 mg | Freq: Once | INTRAVENOUS | Status: AC
  Administered 2021-01-13: 300 mg via INTRAVENOUS
  Filled 2021-01-13: qty 30

## 2021-01-13 MED ORDER — FAMOTIDINE 20 MG IN NS 100 ML IVPB
20.0000 mg | Freq: Once | INTRAVENOUS | Status: AC
  Administered 2021-01-13: 20 mg via INTRAVENOUS
  Filled 2021-01-13: qty 100
  Filled 2021-01-13: qty 20

## 2021-01-13 MED ORDER — DIPHENHYDRAMINE HCL 50 MG/ML IJ SOLN
25.0000 mg | Freq: Once | INTRAMUSCULAR | Status: AC
Start: 2021-01-13 — End: 2021-01-13
  Administered 2021-01-13: 25 mg via INTRAVENOUS
  Filled 2021-01-13: qty 1

## 2021-01-13 MED ORDER — SODIUM CHLORIDE 0.9 % IV SOLN
Freq: Once | INTRAVENOUS | Status: AC
Start: 2021-01-13 — End: 2021-01-13
  Filled 2021-01-13: qty 250

## 2021-01-13 MED ORDER — SODIUM CHLORIDE 0.9 % IV SOLN
10.0000 mg | Freq: Once | INTRAVENOUS | Status: AC
  Administered 2021-01-13: 10 mg via INTRAVENOUS
  Filled 2021-01-13: qty 10

## 2021-01-13 MED ORDER — PROCHLORPERAZINE MALEATE 10 MG PO TABS: 10.0000 mg | ORAL_TABLET | Freq: Four times a day (QID) | ORAL | 2 refills | Status: DC | PRN

## 2021-01-13 NOTE — Progress Notes (Signed)
Patient here for oncology follow-up appointment,  concerns of difficulty swallowing

## 2021-01-13 NOTE — Progress Notes (Signed)
Nicholas Leblanc tolerated his first treatment of Taxol and Carboplatin without any complications. He was made aware that he will need to pick up his Compazine prescription from his pharmacy and to take Compazine for any nausea he may have for the next couple days. Patient verbalized understanding.

## 2021-01-13 NOTE — Patient Instructions (Addendum)
[facility]  Discharge Instructions: Thank you for choosing [facility] to provide your oncology and hematology care.  If you have a lab appointment with the [facility], please go directly to the [facility] and check in at the registration area.  Wear comfortable clothing and clothing appropriate for easy access to any Portacath or PICC line.   We strive to give you quality time with your provider. You may need to reschedule your appointment if you arrive late (15 or more minutes).  Arriving late affects you and other patients whose appointments are after yours.  Also, if you miss three or more appointments without notifying the office, you may be dismissed from the clinic at the provider's discretion.      For prescription refill requests, have your pharmacy contact our office and allow 72 hours for refills to be completed.    Today you received the following chemotherapy and/or immunotherapy agents Carboplatin, Taxol      To help prevent nausea and vomiting after your treatment, we encourage you to take your nausea medication as directed.  BELOW ARE SYMPTOMS THAT SHOULD BE REPORTED IMMEDIATELY: *FEVER GREATER THAN 100.4 F (38 C) OR HIGHER *CHILLS OR SWEATING *NAUSEA AND VOMITING THAT IS NOT CONTROLLED WITH YOUR NAUSEA MEDICATION *UNUSUAL SHORTNESS OF BREATH *UNUSUAL BRUISING OR BLEEDING *URINARY PROBLEMS (pain or burning when urinating, or frequent urination) *BOWEL PROBLEMS (unusual diarrhea, constipation, pain near the anus) TENDERNESS IN MOUTH AND THROAT WITH OR WITHOUT PRESENCE OF ULCERS (sore throat, sores in mouth, or a toothache) UNUSUAL RASH, SWELLING OR PAIN  UNUSUAL VAGINAL DISCHARGE OR ITCHING   Items with * indicate a potential emergency and should be followed up as soon as possible or go to the Emergency Department if any problems should occur.  Please show the CHEMOTHERAPY ALERT CARD or IMMUNOTHERAPY ALERT CARD at  check-in to the Emergency Department and triage nurse.  Should you have questions after your visit or need to cancel or reschedule your appointment, please contact [facility]  [phone] and follow the prompts.  Office hours are 8:00 a.m. to 4:30 p.m. Monday - Friday. Please note that voicemails left after 4:00 p.m. may not be returned until the following business day.  We are closed weekends and major holidays. You have access to a nurse at all times for urgent questions. Please call the main number to the clinic [phone] and follow the prompts.  For any non-urgent questions, you may also contact your provider using MyChart. We now offer e-Visits for anyone 62 and older to request care online for non-urgent symptoms. For details visit [email or web address].   Also download the MyChart app! Go to the app store, search "MyChart", open the app, select [facility], and log in with your MyChart username and password.  Due to Covid, a mask is required upon entering the hospital/clinic. If you do not have a mask, one will be given to you upon arrival. For doctor visits, patients may have 1 support person aged 22 or older with them. For treatment visits, patients cannot have anyone with them due to current Covid guidelines and our immunocompromised population.  Paclitaxel injection What is this medication? PACLITAXEL (PAK li TAX el) is a chemotherapy drug. It targets fast dividing cells, like cancer cells, and causes these cells to die. This medicine is used to treat ovarian cancer, breast cancer, lung cancer, Kaposi's sarcoma, andother cancers. This medicine may be used for other purposes; ask your  health care provider orpharmacist if you have questions. COMMON BRAND NAME(S): Onxol, Taxol What should I tell my care team before I take this medication? They need to know if you have any of these conditions: history of irregular heartbeat liver disease low blood  counts, like low white cell, platelet, or red cell counts lung or breathing disease, like asthma tingling of the fingers or toes, or other nerve disorder an unusual or allergic reaction to paclitaxel, alcohol, polyoxyethylated castor oil, other chemotherapy, other medicines, foods, dyes, or preservatives pregnant or trying to get pregnant breast-feeding How should I use this medication? This drug is given as an infusion into a vein. It is administered in a hospitalor clinic by a specially trained health care professional. Talk to your pediatrician regarding the use of this medicine in children.Special care may be needed. Overdosage: If you think you have taken too much of this medicine contact apoison control center or emergency room at once. NOTE: This medicine is only for you. Do not share this medicine with others. What if I miss a dose? It is important not to miss your dose. Call your doctor or health careprofessional if you are unable to keep an appointment. What may interact with this medication? Do not take this medicine with any of the following medications: live virus vaccines This medicine may also interact with the following medications: antiviral medicines for hepatitis, HIV or AIDS certain antibiotics like erythromycin and clarithromycin certain medicines for fungal infections like ketoconazole and itraconazole certain medicines for seizures like carbamazepine, phenobarbital, phenytoin gemfibrozil nefazodone rifampin St. John's wort This list may not describe all possible interactions. Give your health care provider a list of all the medicines, herbs, non-prescription drugs, or dietary supplements you use. Also tell them if you smoke, drink alcohol, or use illegaldrugs. Some items may interact with your medicine. What should I watch for while using this medication? Your condition will be monitored carefully while you are receiving this medicine. You will need important blood  work done while you are taking thismedicine. This medicine can cause serious allergic reactions. To reduce your risk you will need to take other medicine(s) before treatment with this medicine. If you experience allergic reactions like skin rash, itching or hives, swelling of theface, lips, or tongue, tell your doctor or health care professional right away. In some cases, you may be given additional medicines to help with side effects.Follow all directions for their use. This drug may make you feel generally unwell. This is not uncommon, as chemotherapy can affect healthy cells as well as cancer cells. Report any side effects. Continue your course of treatment even though you feel ill unless yourdoctor tells you to stop. Call your doctor or health care professional for advice if you get a fever, chills or sore throat, or other symptoms of a cold or flu. Do not treat yourself. This drug decreases your body's ability to fight infections. Try toavoid being around people who are sick. This medicine may increase your risk to bruise or bleed. Call your doctor orhealth care professional if you notice any unusual bleeding. Be careful brushing and flossing your teeth or using a toothpick because you may get an infection or bleed more easily. If you have any dental work done,tell your dentist you are receiving this medicine. Avoid taking products that contain aspirin, acetaminophen, ibuprofen, naproxen, or ketoprofen unless instructed by your doctor. These medicines may hide afever. Do not become pregnant while taking this medicine. Women should inform their doctor if  they wish to become pregnant or think they might be pregnant. There is a potential for serious side effects to an unborn child. Talk to your health care professional or pharmacist for more information. Do not breast-feed aninfant while taking this medicine. Men are advised not to father a child while receiving this medicine. This product may contain  alcohol. Ask your pharmacist or healthcare provider if this medicine contains alcohol. Be sure to tell all healthcare providers you are taking this medicine. Certain medicines, like metronidazole and disulfiram, can cause an unpleasant reaction when taken with alcohol. The reaction includes flushing, headache, nausea, vomiting, sweating, and increased thirst. Thereaction can last from 30 minutes to several hours. What side effects may I notice from receiving this medication? Side effects that you should report to your doctor or health care professionalas soon as possible: allergic reactions like skin rash, itching or hives, swelling of the face, lips, or tongue breathing problems changes in vision fast, irregular heartbeat high or low blood pressure mouth sores pain, tingling, numbness in the hands or feet signs of decreased platelets or bleeding - bruising, pinpoint red spots on the skin, black, tarry stools, blood in the urine signs of decreased red blood cells - unusually weak or tired, feeling faint or lightheaded, falls signs of infection - fever or chills, cough, sore throat, pain or difficulty passing urine signs and symptoms of liver injury like dark yellow or brown urine; general ill feeling or flu-like symptoms; light-colored stools; loss of appetite; nausea; right upper belly pain; unusually weak or tired; yellowing of the eyes or skin swelling of the ankles, feet, hands unusually slow heartbeat Side effects that usually do not require medical attention (report to yourdoctor or health care professional if they continue or are bothersome): diarrhea hair loss loss of appetite muscle or joint pain nausea, vomiting pain, redness, or irritation at site where injected tiredness This list may not describe all possible side effects. Call your doctor for medical advice about side effects. You may report side effects to FDA at1-800-FDA-1088. Where should I keep my medication? This drug is  given in a hospital or clinic and will not be stored at home. NOTE: This sheet is a summary. It may not cover all possible information. If you have questions about this medicine, talk to your doctor, pharmacist, orhealth care provider.  2022 Elsevier/Gold Standard (2019-05-15 13:37:23) Carboplatin injection What is this medication? CARBOPLATIN (KAR boe pla tin) is a chemotherapy drug. It targets fast dividing cells, like cancer cells, and causes these cells to die. This medicine is usedto treat ovarian cancer and many other cancers. This medicine may be used for other purposes; ask your health care provider orpharmacist if you have questions. COMMON BRAND NAME(S): Paraplatin What should I tell my care team before I take this medication? They need to know if you have any of these conditions: blood disorders hearing problems kidney disease recent or ongoing radiation therapy an unusual or allergic reaction to carboplatin, cisplatin, other chemotherapy, other medicines, foods, dyes, or preservatives pregnant or trying to get pregnant breast-feeding How should I use this medication? This drug is usually given as an infusion into a vein. It is administered in [place] or clinic by a specially trained health care professional. Talk to your pediatrician regarding the use of this medicine in children.Special care may be needed. Overdosage: If you think you have taken too much of this medicine contact apoison control center or emergency room at once. NOTE: This medicine is only for  you. Do not share this medicine with others. What if I miss a dose? It is important not to miss a dose. Call your doctor or health careprofessional if you are unable to keep an appointment. What may interact with this medication? medicines for seizures medicines to increase blood counts like filgrastim, pegfilgrastim, sargramostim some antibiotics like amikacin, gentamicin, neomycin, streptomycin,  tobramycin vaccines Talk to your doctor or health care professional before taking any of thesemedicines: acetaminophen aspirin ibuprofen ketoprofen naproxen This list may not describe all possible interactions. Give your health care provider a list of all the medicines, herbs, non-prescription drugs, or dietary supplements you use. Also tell them if you smoke, drink alcohol, or use illegaldrugs. Some items may interact with your medicine. What should I watch for while using this medication? Your condition will be monitored carefully while you are receiving this medicine. You will need important blood work done while you are taking thismedicine. This drug may make you feel generally unwell. This is not uncommon, as chemotherapy can affect healthy cells as well as cancer cells. Report any side effects. Continue your course of treatment even though you feel ill unless yourdoctor tells you to stop. In some cases, you may be given additional medicines to help with side effects.Follow all directions for their use. Call your doctor or health care professional for advice if you get a fever, chills or sore throat, or other symptoms of a cold or flu. Do not treat yourself. This drug decreases your body's ability to fight infections. Try toavoid being around people who are sick. This medicine may increase your risk to bruise or bleed. Call your doctor orhealth care professional if you notice any unusual bleeding. Be careful brushing and flossing your teeth or using a toothpick because you may get an infection or bleed more easily. If you have any dental work done,tell your dentist you are receiving this medicine. Avoid taking products that contain aspirin, acetaminophen, ibuprofen, naproxen, or ketoprofen unless instructed by your doctor. These medicines may hide afever. Do not become pregnant while taking this medicine. Women should inform their doctor if they wish to become pregnant or think they might be  pregnant. There is a potential for serious side effects to an unborn child. Talk to your health care professional or pharmacist for more information. Do not breast-feed aninfant while taking this medicine. What side effects may I notice from receiving this medication? Side effects that you should report to your doctor or health care professionalas soon as possible: allergic reactions like skin rash, itching or hives, swelling of the face, lips, or tongue signs of infection - fever or chills, cough, sore throat, pain or difficulty passing urine signs of decreased platelets or bleeding - bruising, pinpoint red spots on the skin, black, tarry stools, nosebleeds signs of decreased red blood cells - unusually weak or tired, fainting spells, lightheadedness breathing problems changes in hearing changes in vision chest pain high blood pressure low blood counts - This drug may decrease the number of white blood cells, red blood cells and platelets. You may be at increased risk for infections and bleeding. nausea and vomiting pain, swelling, redness or irritation at the injection site pain, tingling, numbness in the hands or feet problems with balance, talking, walking trouble passing urine or change in the amount of urine Side effects that usually do not require medical attention (report to yourdoctor or health care professional if they continue or are bothersome): hair loss loss of appetite metallic taste in the  mouth or changes in taste This list may not describe all possible side effects. Call your doctor for medical advice about side effects. You may report side effects to FDA at1-800-FDA-1088. Where should I keep my medication? This drug is given in a hospital or clinic and will not be stored at home. NOTE: This sheet is a summary. It may not cover all possible information. If you have questions about this medicine, talk to your doctor, pharmacist, orhealth care provider.  2022 Elsevier/Gold  Standard (2007-09-18 14:38:05)

## 2021-01-14 ENCOUNTER — Ambulatory Visit
Admission: RE | Admit: 2021-01-14 | Discharge: 2021-01-14 | Disposition: A | Discharge status: Home / Self Care | Payer: BLUE CROSS/BLUE SHIELD | Source: Ambulatory Visit | Attending: Radiation Oncology | Admitting: Radiation Oncology

## 2021-01-14 ENCOUNTER — Telehealth: Payer: Self-pay

## 2021-01-14 DIAGNOSIS — C155 Malignant neoplasm of lower third of esophagus: Secondary | ICD-10-CM | POA: Diagnosis not present

## 2021-01-14 NOTE — Telephone Encounter (Signed)
Telephone call to patient for follow up after receiving first infusion.   Patient daughter states infusion went great.  States eating good and drinking plenty of fluids.   Denies any nausea or vomiting.  Encouraged patient to call for any concerns or questions. 

## 2021-01-15 ENCOUNTER — Encounter: Payer: Self-pay | Admitting: Oncology

## 2021-01-15 ENCOUNTER — Ambulatory Visit
Admission: RE | Admit: 2021-01-15 | Discharge: 2021-01-15 | Disposition: A | Discharge status: Home / Self Care | Payer: BLUE CROSS/BLUE SHIELD | Source: Ambulatory Visit | Attending: Radiation Oncology | Admitting: Radiation Oncology

## 2021-01-15 DIAGNOSIS — C155 Malignant neoplasm of lower third of esophagus: Secondary | ICD-10-CM | POA: Diagnosis not present

## 2021-01-18 ENCOUNTER — Ambulatory Visit
Admission: RE | Admit: 2021-01-18 | Discharge: 2021-01-18 | Disposition: A | Discharge status: Home / Self Care | Payer: BLUE CROSS/BLUE SHIELD | Source: Ambulatory Visit | Attending: Radiation Oncology | Admitting: Radiation Oncology

## 2021-01-18 DIAGNOSIS — C155 Malignant neoplasm of lower third of esophagus: Secondary | ICD-10-CM | POA: Diagnosis not present

## 2021-01-18 NOTE — Progress Notes (Signed)
[place]  Telephone:(336) [phone] Fax:(336) [phone]  ID: Nicholas Leblanc OB: 12-May-1966  MR#: [id number]  [id number]  Patient Care Team: Lesly Rubenstein, MD as PCP - General (Gastroenterology) Clent Jacks, RN as Oncology Nurse Navigator  CHIEF COMPLAINT: Stage III esophageal adenocarcinoma.  INTERVAL HISTORY: Patient returns to clinic today for further evaluation and consideration of cycle 2 of weekly carboplatinum and Taxol along with his daily XRT.  He tolerated his first treatment well without significant side effects.  He continues to have significant difficulty swallowing and weight loss.  He has no neurologic complaints.  He denies any recent fevers or illnesses.  He denies any chest pain, shortness of breath, cough, or hemoptysis.  He denies any nausea, vomiting, constipation, or diarrhea.  He has no urinary complaints.  Patient offers no further specific complaints today.    REVIEW OF SYSTEMS:   Review of Systems  Constitutional: Negative.  Negative for fever, malaise/fatigue and weight loss.  Respiratory: Negative.  Negative for cough, hemoptysis and shortness of breath.   Cardiovascular: Negative.  Negative for chest pain and leg swelling.  Gastrointestinal:  Negative for abdominal pain, heartburn and nausea.  Genitourinary: Negative.  Negative for dysuria.  Musculoskeletal:  Positive for back pain.  Skin: Negative.  Negative for rash.  Neurological: Negative.  Negative for dizziness, focal weakness, weakness and headaches.  Psychiatric/Behavioral: Negative.  The patient is not nervous/anxious.    As per HPI. Otherwise, a complete review of systems is negative.  PAST MEDICAL HISTORY: Past Medical History:  Diagnosis Date   Diabetes mellitus without complication ([place])    Hypertension     PAST SURGICAL HISTORY: No past surgical history on file.  FAMILY HISTORY: No family history on file.  ADVANCED DIRECTIVES (Y/N):  N  HEALTH  MAINTENANCE: Social History   Tobacco Use   Smoking status: Every Day    Packs/day: 1.00    Types: Cigarettes   Smokeless tobacco: Never  Vaping Use   Vaping Use: Every day  Substance Use Topics   Alcohol use: Never   Drug use: Yes    Types: Marijuana     Colonoscopy:  PAP:  Bone density:  Lipid panel:  Allergies  Allergen Reactions   Bee Venom Swelling    Doesn't require Epi-Pen    Current Outpatient Medications  Medication Sig Dispense Refill   FLUoxetine (PROZAC) 20 MG capsule Take 20 mg by mouth daily.     HYDROmorphone (DILAUDID) 8 MG tablet Take by mouth.     ibuprofen (ADVIL) 800 MG tablet Take 800 mg by mouth every 8 (eight) hours as needed.     lisinopril (ZESTRIL) 5 MG tablet Take 5 mg by mouth daily.     metFORMIN (GLUCOPHAGE) 500 MG tablet Take 500 mg by mouth 2 (two) times daily with a meal.     omeprazole (PRILOSEC) 40 MG capsule Take 1 capsule by mouth daily.     ondansetron (ZOFRAN) 8 MG tablet Take 1 tablet (8 mg total) by mouth 2 (two) times daily as needed for refractory nausea / vomiting. Start on day 3 after chemo. 60 tablet 1   oxyCODONE-acetaminophen (PERCOCET) 10-325 MG tablet Take 1 tablet by mouth every 4 (four) hours as needed.     prochlorperazine (COMPAZINE) 10 MG tablet Take 1 tablet (10 mg total) by mouth every 6 (six) hours as needed for nausea or vomiting. 30 tablet 2   simvastatin (ZOCOR) 10 MG tablet Take 10 mg by mouth daily.  No current facility-administered medications for this visit.    OBJECTIVE: Vitals:   01/20/21 1017  BP: (!) 127/93  Pulse: 92  Resp: 20  Temp: (!) 96.3 F (35.7 C)  SpO2: 100%      Body mass index is 25.28 kg/m.    ECOG FS:1 - Symptomatic but completely ambulatory  General: Thin, no acute distress.  Sitting in a wheelchair. Eyes: Pink conjunctiva, anicteric sclera. HEENT: Normocephalic, moist mucous membranes. Lungs: No audible wheezing or coughing. Heart: Regular rate and rhythm. Abdomen: Soft,  nontender, no obvious distention. Musculoskeletal: No edema, cyanosis, or clubbing. Neuro: Alert, answering all questions appropriately. Cranial nerves grossly intact. Skin: No rashes or petechiae noted. Psych: Normal affect.   LAB RESULTS:  Lab Results  Component Value Date   NA 136 01/20/2021   K 3.8 01/20/2021   CL 102 01/20/2021   CO2 24 01/20/2021   GLUCOSE 108 (H) 01/20/2021   BUN 15 01/20/2021   CREATININE 0.55 (L) 01/20/2021   CALCIUM 8.9 01/20/2021   PROT 6.8 01/20/2021   ALBUMIN 4.1 01/20/2021   AST 16 01/20/2021   ALT 14 01/20/2021   ALKPHOS 60 01/20/2021   BILITOT 1.4 (H) 01/20/2021   GFRNONAA >60 01/20/2021    Lab Results  Component Value Date   WBC 6.1 01/20/2021   NEUTROABS 4.3 01/20/2021   HGB 15.6 01/20/2021   HCT 44.7 01/20/2021   MCV 85.5 01/20/2021   PLT 266 01/20/2021     STUDIES: NM PET Image Initial (PI) Skull Base To Thigh  Result Date: 12/31/2020 CLINICAL DATA:  Initial treatment strategy for esophageal cancer. EXAM: NUCLEAR MEDICINE PET SKULL BASE TO THIGH TECHNIQUE: 10.03 mCi F-18 FDG was injected intravenously. Full-ring PET imaging was performed from the skull base to thigh after the radiotracer. CT data was obtained and used for attenuation correction and anatomic localization. Fasting blood glucose: 150 mg/dl COMPARISON:  Chest CT 12/01/2020 FINDINGS: Mediastinal blood pool activity: SUV max 2.50 Liver activity: SUV max NA NECK: No hypermetabolic lymph nodes in the neck. Incidental CT findings: none CHEST: Distal esophageal mass is hypermetabolic and extends into the GE junction. SUV max is 22.04. No paraesophageal lymphadenopathy and no enlarged or hypermetabolic mediastinal or hilar lymph nodes. No supraclavicular or axillary adenopathy. No worrisome pulmonary nodules are identified the CT scan. Incidental CT findings: none ABDOMEN/PELVIS: No enlarged or hypermetabolic gastrohepatic ligament, celiac axis or retroperitoneal lymph nodes. No  findings suspicious for hepatic metastatic disease. The adrenal glands are normal. Incidental CT findings: none SKELETON: Incidental CT findings: No findings suspicious for osseous metastatic disease. IMPRESSION: 1. Hypermetabolic distal esophageal mass extending into the GE junction consistent with known esophageal cancer. 2. No findings for locoregional hypermetabolic lymphadenopathy or distant metastatic disease. Electronically Signed   By: Marijo Sanes M.D.   On: 12/31/2020 21:23    ASSESSMENT: Stage III esophageal adenocarcinoma.  PLAN:    1. Stage III esophageal adenocarcinoma: EGD and biopsy completed in [place] confirms adenocarcinoma.  Recent EUS and CT scan confirmed stage of disease.  PET scan results from December 31, 2020 reviewed independently and reported as above with hypermetabolic distal esophageal mass, without local regional lymphadenopathy.  Patient will benefit from neoadjuvant chemotherapy with weekly carboplatinum and Taxol along with daily XRT.  This will be followed by partial esophagectomy at [facility].  Proceed with cycle 2 of weekly carboplatinum and Taxol today.  Continue daily XRT.  Return to clinic in 1 week for further evaluation and consideration of cycle 3.  2.  Back pain: Chronic and unchanged. 3.  Weight loss: Patient has agreed to J-tube placement.  Referral has been sent to [facility] for procedure.  Appreciate dietary and palliative care input.   I spent a total of 30 minutes reviewing chart data, face-to-face evaluation with the patient, counseling and coordination of care as detailed above.   Patient expressed understanding and was in agreement with this plan. He also understands that He can call clinic at any time with any questions, concerns, or complaints.   Cancer Staging Esophageal adenocarcinoma [facility]) Staging form: Esophagus - Adenocarcinoma, AJCC 8th Edition - Clinical stage from 12/04/2020: Stage III (cT3, cN1, cM0) - Signed by [name], MD on 12/04/2020 Stage prefix: Initial diagnosis  [name], MD   01/21/2021 7:00 AM

## 2021-01-19 ENCOUNTER — Inpatient Hospital Stay: Payer: BLUE CROSS/BLUE SHIELD | Admitting: Hospice and Palliative Medicine

## 2021-01-19 ENCOUNTER — Ambulatory Visit
Admission: RE | Admit: 2021-01-19 | Discharge: 2021-01-19 | Disposition: A | Discharge status: Home / Self Care | Payer: BLUE CROSS/BLUE SHIELD | Source: Ambulatory Visit | Attending: Radiation Oncology | Admitting: Radiation Oncology

## 2021-01-19 DIAGNOSIS — C155 Malignant neoplasm of lower third of esophagus: Secondary | ICD-10-CM | POA: Diagnosis not present

## 2021-01-20 ENCOUNTER — Inpatient Hospital Stay: Payer: BLUE CROSS/BLUE SHIELD

## 2021-01-20 ENCOUNTER — Ambulatory Visit
Admission: RE | Admit: 2021-01-20 | Discharge: 2021-01-20 | Disposition: A | Discharge status: Home / Self Care | Payer: BLUE CROSS/BLUE SHIELD | Source: Ambulatory Visit | Attending: Radiation Oncology | Admitting: Radiation Oncology

## 2021-01-20 ENCOUNTER — Inpatient Hospital Stay (HOSPITAL_BASED_OUTPATIENT_CLINIC_OR_DEPARTMENT_OTHER): Payer: BLUE CROSS/BLUE SHIELD | Admitting: Hospice and Palliative Medicine

## 2021-01-20 ENCOUNTER — Inpatient Hospital Stay (HOSPITAL_BASED_OUTPATIENT_CLINIC_OR_DEPARTMENT_OTHER): Payer: BLUE CROSS/BLUE SHIELD | Admitting: Oncology

## 2021-01-20 VITALS — BP 127/93 | HR 92 | Temp 96.3°F | Resp 20 | Wt 171.2 lb

## 2021-01-20 DIAGNOSIS — Z515 Encounter for palliative care: Secondary | ICD-10-CM | POA: Diagnosis not present

## 2021-01-20 DIAGNOSIS — C159 Malignant neoplasm of esophagus, unspecified: Secondary | ICD-10-CM

## 2021-01-20 DIAGNOSIS — C155 Malignant neoplasm of lower third of esophagus: Secondary | ICD-10-CM | POA: Diagnosis not present

## 2021-01-20 LAB — CBC WITH DIFFERENTIAL/PLATELET
Abs Immature Granulocytes: 0.06 10*3/uL (ref 0.00–0.07)
Basophils Absolute: 0 10*3/uL (ref 0.0–0.1)
Basophils Relative: 1 %
Eosinophils Absolute: 0.3 10*3/uL (ref 0.0–0.5)
Eosinophils Relative: 6 %
HCT: 44.7 % (ref 39.0–52.0)
Hemoglobin: 15.6 g/dL (ref 13.0–17.0)
Immature Granulocytes: 1 %
Lymphocytes Relative: 17 %
Lymphs Abs: 1.1 10*3/uL (ref 0.7–4.0)
MCH: 29.8 pg (ref 26.0–34.0)
MCHC: 34.9 g/dL (ref 30.0–36.0)
MCV: 85.5 fL (ref 80.0–100.0)
Monocytes Absolute: 0.3 10*3/uL (ref 0.1–1.0)
Monocytes Relative: 5 %
Neutro Abs: 4.3 10*3/uL (ref 1.7–7.7)
Neutrophils Relative %: 70 %
Platelets: 266 10*3/uL (ref 150–400)
RBC: 5.23 MIL/uL (ref 4.22–5.81)
RDW: 11.9 % (ref 11.5–15.5)
WBC: 6.1 10*3/uL (ref 4.0–10.5)
nRBC: 0 % (ref 0.0–0.2)

## 2021-01-20 LAB — COMPREHENSIVE METABOLIC PANEL
ALT: 14 U/L (ref 0–44)
AST: 16 U/L (ref 15–41)
Albumin: 4.1 g/dL (ref 3.5–5.0)
Alkaline Phosphatase: 60 U/L (ref 38–126)
Anion gap: 10 (ref 5–15)
BUN: 15 mg/dL (ref 6–20)
CO2: 24 mmol/L (ref 22–32)
Calcium: 8.9 mg/dL (ref 8.9–10.3)
Chloride: 102 mmol/L (ref 98–111)
Creatinine, Ser: 0.55 mg/dL — ABNORMAL LOW (ref 0.61–1.24)
GFR, Estimated: >60 mL/min (ref >60)
Glucose, Bld: 108 mg/dL — ABNORMAL HIGH (ref 70–99)
Potassium: 3.8 mmol/L (ref 3.5–5.1)
Sodium: 136 mmol/L (ref 135–145)
Total Bilirubin: 1.4 mg/dL — ABNORMAL HIGH (ref 0.3–1.2)
Total Protein: 6.8 g/dL (ref 6.5–8.1)

## 2021-01-20 MED ORDER — SODIUM CHLORIDE 0.9 % IV SOLN
50.0000 mg/m2 | Freq: Once | INTRAVENOUS | Status: AC
  Administered 2021-01-20: 108 mg via INTRAVENOUS
  Filled 2021-01-20: qty 18

## 2021-01-20 MED ORDER — SODIUM CHLORIDE 0.9 % IV SOLN
Freq: Once | INTRAVENOUS | Status: AC
  Filled 2021-01-20: qty 250

## 2021-01-20 MED ORDER — PALONOSETRON HCL INJECTION 0.25 MG/5ML
0.2500 mg | Freq: Once | INTRAVENOUS | Status: AC
Start: 2021-01-20 — End: 2021-01-20
  Administered 2021-01-20: 0.25 mg via INTRAVENOUS
  Filled 2021-01-20: qty 5

## 2021-01-20 MED ORDER — DIPHENHYDRAMINE HCL 50 MG/ML IJ SOLN
25.0000 mg | Freq: Once | INTRAMUSCULAR | Status: AC
Start: 2021-01-20 — End: 2021-01-20
  Administered 2021-01-20: 25 mg via INTRAVENOUS
  Filled 2021-01-20: qty 1

## 2021-01-20 MED ORDER — SODIUM CHLORIDE 0.9 % IV SOLN
300.0000 mg | Freq: Once | INTRAVENOUS | Status: AC
  Administered 2021-01-20: 300 mg via INTRAVENOUS
  Filled 2021-01-20: qty 30

## 2021-01-20 MED ORDER — SODIUM CHLORIDE 0.9 % IV SOLN
10.0000 mg | Freq: Once | INTRAVENOUS | Status: AC
  Administered 2021-01-20: 10 mg via INTRAVENOUS
  Filled 2021-01-20: qty 10

## 2021-01-20 MED ORDER — FAMOTIDINE 20 MG IN NS 100 ML IVPB
20.0000 mg | Freq: Once | INTRAVENOUS | Status: AC
Start: 2021-01-20 — End: 2021-01-20
  Administered 2021-01-20: 20 mg via INTRAVENOUS
  Filled 2021-01-20: qty 20

## 2021-01-20 NOTE — Progress Notes (Signed)
[place]  [phone] [phone]) [phone]   Name: Nicholas Leblanc Date: 01/20/2021 MRN: [id number]  DOB: 1965-10-28  Patient Care Team: Lesly Rubenstein, MD as PCP - General (Gastroenterology) Clent Jacks, RN as Oncology Nurse Navigator    REASON FOR CONSULTATION: Nicholas Leblanc is a 55 y.o. male with multiple medical problems including stage III esophageal adenocarcinoma on concurrent chemotherapy/XRT.  Patient has had dysphagia, poor oral intake, and progressive weight loss.  Palliative care was consulted help address goals.  SOCIAL HISTORY:     reports that he has been smoking cigarettes. He has been smoking an average of 1 pack per day. He has never used smokeless tobacco. He reports current drug use. Drug: Marijuana. He reports that he does not drink alcohol.  Patient is unmarried but does have a long-term girlfriend of 13 years.  Patient lives at home with his daughter.  He has 2 other biological children.  ADVANCE DIRECTIVES:  Not on file  CODE STATUS:   PAST MEDICAL HISTORY: Past Medical History:  Diagnosis Date   Diabetes mellitus without complication ([place])    Hypertension     PAST SURGICAL HISTORY: No past surgical history on file.  HEMATOLOGY/ONCOLOGY HISTORY:  Oncology History  Esophageal adenocarcinoma ([place])  12/04/2020 Initial Diagnosis   Esophageal adenocarcinoma ([place])    12/04/2020 Cancer Staging   Staging form: Esophagus - Adenocarcinoma, AJCC 8th Edition - Clinical stage from 12/04/2020: Stage III (cT3, cN1, cM0) - Signed by [name], MD on 12/04/2020  Stage prefix: Initial diagnosis    01/13/2021 -  Chemotherapy    Patient is on Treatment Plan: ESOPHAGUS CARBOPLATIN/PACLITAXEL WEEKLY X 6 WEEKS WITH XRT           ALLERGIES:  is allergic to bee venom.  MEDICATIONS:  Current Outpatient Medications  Medication Sig Dispense Refill   FLUoxetine (PROZAC) 20 MG capsule  Take 20 mg by mouth daily.     HYDROmorphone (DILAUDID) 8 MG tablet Take by mouth.     ibuprofen (ADVIL) 800 MG tablet Take 800 mg by mouth every 8 (eight) hours as needed.     lisinopril (ZESTRIL) 5 MG tablet Take 5 mg by mouth daily.     metFORMIN (GLUCOPHAGE) 500 MG tablet Take 500 mg by mouth 2 (two) times daily with a meal.     omeprazole (PRILOSEC) 40 MG capsule Take 1 capsule by mouth daily.     ondansetron (ZOFRAN) 8 MG tablet Take 1 tablet (8 mg total) by mouth 2 (two) times daily as needed for refractory nausea / vomiting. Start on day 3 after chemo. 60 tablet 1   oxyCODONE-acetaminophen (PERCOCET) 10-325 MG tablet Take 1 tablet by mouth every 4 (four) hours as needed.     prochlorperazine (COMPAZINE) 10 MG tablet Take 1 tablet (10 mg total) by mouth every 6 (six) hours as needed for nausea or vomiting. 30 tablet 2   simvastatin (ZOCOR) 10 MG tablet Take 10 mg by mouth daily.     No current facility-administered medications for this visit.    VITAL SIGNS: There were no vitals taken for this visit. There were no vitals filed for this visit.  Estimated body mass index is 26.43 kg/m as calculated from the following:   Height as of 12/31/20: '5\' 9"'$  (1.753 m).   Weight as of 01/13/21: 179 lb (81.2 kg).  LABS: CBC:    Component Value Date/Time   WBC 6.1 01/20/2021 0919  HGB 15.6 01/20/2021 0919   HCT 44.7 01/20/2021 0919   PLT 266 01/20/2021 0919   MCV 85.5 01/20/2021 0919   NEUTROABS 4.3 01/20/2021 0919   LYMPHSABS 1.1 01/20/2021 0919   MONOABS 0.3 01/20/2021 0919   EOSABS 0.3 01/20/2021 0919   BASOSABS 0.0 01/20/2021 0919   Comprehensive Metabolic Panel:    Component Value Date/Time   NA 136 01/20/2021 0919   K 3.8 01/20/2021 0919   CL 102 01/20/2021 0919   CO2 24 01/20/2021 0919   BUN 15 01/20/2021 0919   CREATININE 0.55 (L) 01/20/2021 0919   GLUCOSE 108 (H) 01/20/2021 0919   CALCIUM 8.9 01/20/2021 0919   AST 16 01/20/2021 0919   ALT 14 01/20/2021 0919   ALKPHOS  60 01/20/2021 0919   BILITOT 1.4 (H) 01/20/2021 0919   PROT 6.8 01/20/2021 0919   ALBUMIN 4.1 01/20/2021 0919    RADIOGRAPHIC STUDIES: NM PET Image Initial (PI) Skull Base To Thigh  Result Date: 12/31/2020 CLINICAL DATA:  Initial treatment strategy for esophageal cancer. EXAM: NUCLEAR MEDICINE PET SKULL BASE TO THIGH TECHNIQUE: 10.03 mCi F-18 FDG was injected intravenously. Full-ring PET imaging was performed from the skull base to thigh after the radiotracer. CT data was obtained and used for attenuation correction and anatomic localization. Fasting blood glucose: 150 mg/dl COMPARISON:  Chest CT 12/01/2020 FINDINGS: Mediastinal blood pool activity: SUV max 2.50 Liver activity: SUV max NA NECK: No hypermetabolic lymph nodes in the neck. Incidental CT findings: none CHEST: Distal esophageal mass is hypermetabolic and extends into the GE junction. SUV max is 22.04. No paraesophageal lymphadenopathy and no enlarged or hypermetabolic mediastinal or hilar lymph nodes. No supraclavicular or axillary adenopathy. No worrisome pulmonary nodules are identified the CT scan. Incidental CT findings: none ABDOMEN/PELVIS: No enlarged or hypermetabolic gastrohepatic ligament, celiac axis or retroperitoneal lymph nodes. No findings suspicious for hepatic metastatic disease. The adrenal glands are normal. Incidental CT findings: none SKELETON: Incidental CT findings: No findings suspicious for osseous metastatic disease. IMPRESSION: 1. Hypermetabolic distal esophageal mass extending into the GE junction consistent with known esophageal cancer. 2. No findings for locoregional hypermetabolic lymphadenopathy or distant metastatic disease. Electronically Signed   By: Marijo Sanes M.D.   On: 12/31/2020 21:23    PERFORMANCE STATUS (ECOG) : 1 - Symptomatic but completely ambulatory  Review of Systems Unless otherwise noted, a complete review of systems is negative.  Physical Exam General: NAD Pulmonary:  Unlabored Extremities: no edema, no joint deformities Skin: no rashes Neurological: Weakness but otherwise nonfocal  IMPRESSION: Patient has had persistent weight loss.  In May, he weighed 215 pounds.  He is now down to 179 pounds, which reflects about 20% reduction in total body weight in 2 months.    Patient reports minimal oral intake.  He endorses dysphagia and says that anything he tries to eat or drink ultimately results in vomiting.  He does have persistent nausea and is taking ondansetron and Compazine with some improvement.  Given persistent weight loss in setting of esophageal cancer, PEG or J-tube would seem reasonable to allow for supplemental nutrition.  Patient is interested in pursuing feeding tube.  Discussed with Dr. Grayland Ormond and will have nurse navigator coordinate with [place].  Discussed symptom management options for nausea including alternative antiemetics.  Daughter would prefer not starting or adjusting any medications at the present time until feeding tube is placed.  Pain is reportedly stable on hydromorphone.  Patient is well supported at home.  His performance status is stable and  he remains independent with self-care.  PLAN: -Continue current scope of treatment -Recommend referral to [facility] for feeding tube -Nutrition consult pending -Continue hydromorphone as needed for pain -Continue antiemetics as needed -Follow-up as needed  Case and plan discussed with Dr. Grayland Ormond   Patient expressed understanding and was in agreement with this plan. He also understands that He can call the clinic at any time with any questions, concerns, or complaints.     Time Total: 20 minutes  Visit consisted of counseling and education dealing with the complex and emotionally intense issues of symptom management and palliative care in the setting of serious and potentially life-threatening illness.Greater than 50%  of this time was spent counseling and coordinating care related to  the above assessment and plan.  Signed by: Altha Harm, PhD, NP-C

## 2021-01-20 NOTE — Progress Notes (Signed)
Nutrition Assessment   Reason for Assessment:  Weight loss, wanting feeding tube, unable to eat   ASSESSMENT:  55 year old male with stage III esophageal adenocarcinoma, followed by Dr Grayland Ormond.  Past medical history of DM, HTN.  Patient receiving concurrent chemotherapy and radiation therapy.    Met with patient and daughter today in clinic.  Patient reports that oral intake is limited due to "everything eaten is thrown back up."  Ate few bites of nutty buddy pudding but mainly water is the only thing staying down.  Has tried ensure, boost shakes and others without success.  Says that he has pureed foods and still unable to keep those down.  Requesting feeding tube placement.    Per MD note patient candidate for partial esophagectomy after treatment at [facility].    Nutrition Focused Physical Exam:  Orbital - mild Cheek- mild Upper arm- mild Ribs- normal Temple- mild Clavicle- mild Scapula- normal Hand- normal Thigh- mild Knee- normal Calf- mild  Medications: metformin, prilosec, zofran, compazine   Labs: glucose 108   Anthropometrics:   Height: 69 inches Weight: 171 lb 3.2 oz today 215 lb 11.2 oz on 11/19/2020  BMI: 25  20% weight loss in the last 2 months, significant  Estimated Energy Needs  Kcals: 2300-2700 Protein: 115-135 g Fluid: 2.3 L   NUTRITION DIAGNOSIS: Inadequate oral intake related to cancer and cancer related treatment side effects as evidenced by 20% weight loss in 2 months, significant and eating less than 50% estimate energy needs for > or equal to 5 days   MALNUTRITION DIAGNOSIS: Patient meets criteria for severe malnutrition as evidenced by 20% weight loss in the last 2 months and meeting less than or equal to 50% estimate energy need for > 5 day.   INTERVENTION:  Patient would benefit from feeding tube placement. Most often J-tube is preferred vs G tube if patient is surgical candidate.  Discussion with [facility] surgery team will be coordinated.    Talked briefly about feeding tubes, difference and methods of feeding.  Answered questions.   Provided samples of D[employer] [place] 1.4 shake to try (thinner), Boost soothe, Reason shake. Discussed with Dr Grayland Ormond about setting up fluids support for patient in the until feeding tube is able to be placed.    MONITORING, EVALUATION, GOAL: weight trends, intake, feeding tube placement   Next Visit: Friday, July 29 during infusion  Myan Locatelli B. Zenia Resides, [place], [place] Registered Dietitian [phone] (mobile)

## 2021-01-21 ENCOUNTER — Ambulatory Visit
Admission: RE | Admit: 2021-01-21 | Discharge: 2021-01-21 | Disposition: A | Discharge status: Home / Self Care | Payer: BLUE CROSS/BLUE SHIELD | Source: Ambulatory Visit | Attending: Radiation Oncology | Admitting: Radiation Oncology

## 2021-01-21 ENCOUNTER — Encounter: Payer: Self-pay | Admitting: Oncology

## 2021-01-21 ENCOUNTER — Telehealth: Payer: Self-pay

## 2021-01-21 DIAGNOSIS — C155 Malignant neoplasm of lower third of esophagus: Secondary | ICD-10-CM | POA: Diagnosis not present

## 2021-01-21 NOTE — Telephone Encounter (Signed)
Referral sent to Dr. Elenor Quinones for surgical evaluation and J tube placement.

## 2021-01-22 ENCOUNTER — Inpatient Hospital Stay: Payer: BLUE CROSS/BLUE SHIELD

## 2021-01-22 ENCOUNTER — Ambulatory Visit
Admission: RE | Admit: 2021-01-22 | Discharge: 2021-01-22 | Disposition: A | Discharge status: Home / Self Care | Payer: BLUE CROSS/BLUE SHIELD | Source: Ambulatory Visit | Attending: Radiation Oncology | Admitting: Radiation Oncology

## 2021-01-22 DIAGNOSIS — C155 Malignant neoplasm of lower third of esophagus: Secondary | ICD-10-CM | POA: Diagnosis not present

## 2021-01-24 NOTE — Progress Notes (Signed)
Nicholas Leblanc  [phone] [phone]) [phone]  ID: LADONTA COLIN OB: 1965/07/28  MR#: [id number]  [id number]  Patient Care Team: Lesly Rubenstein, MD as PCP - General (Gastroenterology) Clent Jacks, RN as Oncology Nurse Navigator  CHIEF COMPLAINT: Stage III esophageal adenocarcinoma.  INTERVAL HISTORY: Patient returns to clinic today for further evaluation and consideration of cycle 3 of weekly carboplatinum and Taxol along with his daily XRT.  He continues to tolerate his treatments well without significant side effects. He continues to have significant difficulty swallowing and weight loss, but has an appointment at [facility] tomorrow for consideration of J-tube.  He has no neurologic complaints.  He denies any recent fevers or illnesses.  He denies any chest pain, shortness of breath, cough, or hemoptysis.  He denies any nausea, vomiting, constipation, or diarrhea.  He has no urinary complaints.  Patient offers no further specific complaints today.  REVIEW OF SYSTEMS:   Review of Systems  Constitutional:  Positive for malaise/fatigue and weight loss. Negative for fever.  Respiratory: Negative.  Negative for cough, hemoptysis and shortness of breath.   Cardiovascular: Negative.  Negative for chest pain and leg swelling.  Gastrointestinal:  Negative for abdominal pain, heartburn and nausea.  Genitourinary: Negative.  Negative for dysuria.  Musculoskeletal:  Positive for back pain.  Skin: Negative.  Negative for rash.  Neurological:  Positive for weakness. Negative for dizziness, focal weakness and headaches.  Psychiatric/Behavioral: Negative.  The patient is not nervous/anxious.    As per HPI. Otherwise, a complete review of systems is negative.  PAST MEDICAL HISTORY: Past Medical History:  Diagnosis Date   Diabetes mellitus without complication ([place])    Hypertension     PAST SURGICAL HISTORY: No past surgical history on  file.  FAMILY HISTORY: History reviewed. No pertinent family history.  ADVANCED DIRECTIVES (Y/N):  N  HEALTH MAINTENANCE: Social History   Tobacco Use   Smoking status: Every Day    Packs/day: 1.00    Types: Cigarettes   Smokeless tobacco: Never  Vaping Use   Vaping Use: Every day  Substance Use Topics   Alcohol use: Never   Drug use: Yes    Types: Marijuana     Colonoscopy:  PAP:  Bone density:  Lipid panel:  Allergies  Allergen Reactions   Bee Venom Swelling    Doesn't require Epi-Pen    Current Outpatient Medications  Medication Sig Dispense Refill   FLUoxetine (PROZAC) 20 MG capsule Take 20 mg by mouth daily.     HYDROmorphone (DILAUDID) 8 MG tablet Take by mouth.     ibuprofen (ADVIL) 800 MG tablet Take 800 mg by mouth every 8 (eight) hours as needed.     lisinopril (ZESTRIL) 5 MG tablet Take 5 mg by mouth daily.     metFORMIN (GLUCOPHAGE) 500 MG tablet Take 500 mg by mouth 2 (two) times daily with a meal.     omeprazole (PRILOSEC) 40 MG capsule Take 1 capsule by mouth daily.     ondansetron (ZOFRAN) 8 MG tablet Take 1 tablet (8 mg total) by mouth 2 (two) times daily as needed for refractory nausea / vomiting. Start on day 3 after chemo. 60 tablet 1   oxyCODONE-acetaminophen (PERCOCET) 10-325 MG tablet Take 1 tablet by mouth every 4 (four) hours as needed.     prochlorperazine (COMPAZINE) 10 MG tablet Take 1 tablet (10 mg total) by mouth every 6 (six) hours as needed for nausea or vomiting. 30 tablet 2  simvastatin (ZOCOR) 10 MG tablet Take 10 mg by mouth daily.     No current facility-administered medications for this visit.   Facility-Administered Medications Ordered in Other Visits  Medication Dose Route Frequency Provider Last Rate Last Admin   CARBOplatin (PARAPLATIN) 300 mg in sodium chloride 0.9 % 250 mL chemo infusion  300 mg Intravenous Once [name], MD       dexamethasone (DECADRON) 10 mg in sodium chloride 0.9 % 50 mL IVPB  10 mg  Intravenous Once [name], MD 204 mL/hr at 01/27/21 1040 10 mg at 01/27/21 1040   famotidine (PEPCID) IVPB 20 mg in NS 100 mL IVPB  20 mg Intravenous Once [name], MD       PACLitaxel (TAXOL) 96 mg in sodium chloride 0.9 % 250 mL chemo infusion (</= '80mg'$ /m2)  50 mg/m2 (Order-Specific) Intravenous Once [name], MD       sodium chloride flush (NS) 0.9 % injection 10 mL  10 mL Intracatheter PRN [name], MD        OBJECTIVE: Vitals:   01/27/21 0947  BP: 122/80  Pulse: 89  Resp: 18  Temp: (!) 97 F (36.1 C)      Body mass index is 25.02 kg/m.    ECOG FS:1 - Symptomatic but completely ambulatory  General: Thin, no acute distress. Eyes: Pink conjunctiva, anicteric sclera. HEENT: Normocephalic, moist mucous membranes. Lungs: No audible wheezing or coughing. Heart: Regular rate and rhythm. Abdomen: Soft, nontender, no obvious distention. Musculoskeletal: No edema, cyanosis, or clubbing. Neuro: Alert, answering all questions appropriately. Cranial nerves grossly intact. Skin: No rashes or petechiae noted. Psych: Normal affect.    LAB RESULTS:  Lab Results  Component Value Date   NA 137 01/27/2021   K 3.6 01/27/2021   CL 100 01/27/2021   CO2 28 01/27/2021   GLUCOSE 164 (H) 01/27/2021   BUN 14 01/27/2021   CREATININE 0.62 01/27/2021   CALCIUM 9.1 01/27/2021   PROT 6.7 01/27/2021   ALBUMIN 4.1 01/27/2021   AST 22 01/27/2021   ALT 18 01/27/2021   ALKPHOS 59 01/27/2021   BILITOT 1.0 01/27/2021   GFRNONAA >60 01/27/2021    Lab Results  Component Value Date   WBC 5.2 01/27/2021   NEUTROABS 4.0 01/27/2021   HGB 14.4 01/27/2021   HCT 41.2 01/27/2021   MCV 87.3 01/27/2021   PLT 258 01/27/2021     STUDIES: NM PET Image Initial (PI) Skull Base To Thigh  Result Date: 12/31/2020 CLINICAL DATA:  Initial treatment strategy for esophageal cancer. EXAM: NUCLEAR MEDICINE PET SKULL BASE TO THIGH TECHNIQUE: 10.03 mCi F-18 FDG was  injected intravenously. Full-ring PET imaging was performed from the skull base to thigh after the radiotracer. CT data was obtained and used for attenuation correction and anatomic localization. Fasting blood glucose: 150 mg/dl COMPARISON:  Chest CT 12/01/2020 FINDINGS: Mediastinal blood pool activity: SUV max 2.50 Liver activity: SUV max NA NECK: No hypermetabolic lymph nodes in the neck. Incidental CT findings: none CHEST: Distal esophageal mass is hypermetabolic and extends into the GE junction. SUV max is 22.04. No paraesophageal lymphadenopathy and no enlarged or hypermetabolic mediastinal or hilar lymph nodes. No supraclavicular or axillary adenopathy. No worrisome pulmonary nodules are identified the CT scan. Incidental CT findings: none ABDOMEN/PELVIS: No enlarged or hypermetabolic gastrohepatic ligament, celiac axis or retroperitoneal lymph nodes. No findings suspicious for hepatic metastatic disease. The adrenal glands are normal. Incidental CT findings: none SKELETON: Incidental CT findings: No findings  suspicious for osseous metastatic disease. IMPRESSION: 1. Hypermetabolic distal esophageal mass extending into the GE junction consistent with known esophageal cancer. 2. No findings for locoregional hypermetabolic lymphadenopathy or distant metastatic disease. Electronically Signed   By: Marijo Sanes M.D.   On: 12/31/2020 21:23    ASSESSMENT: Stage III esophageal adenocarcinoma.  PLAN:    1. Stage III esophageal adenocarcinoma: EGD and biopsy completed in [place] confirms adenocarcinoma.  Recent EUS and CT scan confirmed stage of disease.  PET scan results from December 31, 2020 reviewed independently with hypermetabolic distal esophageal mass, without local regional lymphadenopathy.  Patient will benefit from neoadjuvant chemotherapy with weekly carboplatinum and Taxol along with daily XRT.  This will be followed by partial esophagectomy at [facility].  Proceed with cycle 3 of weekly carboplatin  and Taxol today.  Continue daily XRT.  Return to clinic in 1 week for further evaluation and consideration of cycle 4.    2.  Back pain: Chronic and unchanged. 3.  Weight loss: Patient has an appointment at [facility] tomorrow morning for consideration of J-tube placement. Appreciate dietary and palliative care input.  I spent a total of 30 minutes reviewing chart data, face-to-face evaluation with the patient, counseling and coordination of care as detailed above.    Patient expressed understanding and was in agreement with this plan. He also understands that He can call clinic at any time with any questions, concerns, or complaints.   Cancer Staging Esophageal adenocarcinoma [facility]) Staging form: Esophagus - Adenocarcinoma, AJCC 8th Edition - Clinical stage from 12/04/2020: Stage III (cT3, cN1, cM0) - Signed by [name], MD on 12/04/2020 Stage prefix: Initial diagnosis  [name], MD   01/27/2021 10:51 AM

## 2021-01-25 ENCOUNTER — Inpatient Hospital Stay: Payer: BLUE CROSS/BLUE SHIELD | Attending: Oncology

## 2021-01-25 ENCOUNTER — Telehealth: Payer: Self-pay | Admitting: *Deleted

## 2021-01-25 ENCOUNTER — Other Ambulatory Visit: Payer: Self-pay

## 2021-01-25 ENCOUNTER — Inpatient Hospital Stay: Payer: BLUE CROSS/BLUE SHIELD

## 2021-01-25 ENCOUNTER — Ambulatory Visit
Admission: RE | Admit: 2021-01-25 | Discharge: 2021-01-25 | Disposition: A | Discharge status: Home / Self Care | Payer: BLUE CROSS/BLUE SHIELD | Source: Ambulatory Visit | Attending: Radiation Oncology | Admitting: Radiation Oncology

## 2021-01-25 DIAGNOSIS — G8929 Other chronic pain: Secondary | ICD-10-CM | POA: Insufficient documentation

## 2021-01-25 DIAGNOSIS — C155 Malignant neoplasm of lower third of esophagus: Secondary | ICD-10-CM | POA: Diagnosis present

## 2021-01-25 DIAGNOSIS — C159 Malignant neoplasm of esophagus, unspecified: Secondary | ICD-10-CM | POA: Insufficient documentation

## 2021-01-25 DIAGNOSIS — D649 Anemia, unspecified: Secondary | ICD-10-CM | POA: Insufficient documentation

## 2021-01-25 DIAGNOSIS — R630 Anorexia: Secondary | ICD-10-CM | POA: Insufficient documentation

## 2021-01-25 DIAGNOSIS — Z79899 Other long term (current) drug therapy: Secondary | ICD-10-CM | POA: Insufficient documentation

## 2021-01-25 DIAGNOSIS — Z5111 Encounter for antineoplastic chemotherapy: Secondary | ICD-10-CM | POA: Insufficient documentation

## 2021-01-25 DIAGNOSIS — R634 Abnormal weight loss: Secondary | ICD-10-CM | POA: Insufficient documentation

## 2021-01-25 DIAGNOSIS — M549 Dorsalgia, unspecified: Secondary | ICD-10-CM | POA: Insufficient documentation

## 2021-01-25 DIAGNOSIS — Z510 Encounter for antineoplastic radiation therapy: Secondary | ICD-10-CM | POA: Diagnosis not present

## 2021-01-25 NOTE — Telephone Encounter (Signed)
Pt in clinic for radiation treatment. He canceled his Fluid appt stating he doesn't need IVFs today. He is keeping his appt with dietician, Joli.

## 2021-01-25 NOTE — Progress Notes (Signed)
Nutrition Follow-up:  Patient with esophageal adenocarcinoma, followed by Dr Grayland Ormond.  Patient receiving concurrent chemotherapy and radiation therapy.    Met with patient and daughter after radiation.  Patient declined IV fluids today, says that he does not need them.  Patient reports that has been drinking water mostly and juice (orange juice and apple juice), eating grits, and nutty buddy pudding mixed with ensure.  Says that foods/liquids are staying down more at this time.  Patient did not like [employer] 1.4 shake or boost soothe  Referral has been sent to [facility]  Medications: reviewed  Labs: reviewed  Anthropometrics:   Weight 171 lb 3.2 oz on 7/27 215 lb 11.2 oz on 11/19/20    NUTRITION DIAGNOSIS: Inadequate oral intake continues   INTERVENTION:  Patient pending surgical evaluation at [facility].   Provided recipes for shakes and other high calorie foods.     MONITORING, EVALUATION, GOAL: Weight trends, intake   NEXT VISIT: Wed, August 10 during infusion if not sooner  Nicholas Leblanc B. Zenia Resides, [place], [place] Registered Dietitian [phone] (mobile)

## 2021-01-26 ENCOUNTER — Ambulatory Visit
Admission: RE | Admit: 2021-01-26 | Discharge: 2021-01-26 | Disposition: A | Discharge status: Home / Self Care | Payer: BLUE CROSS/BLUE SHIELD | Source: Ambulatory Visit | Attending: Radiation Oncology | Admitting: Radiation Oncology

## 2021-01-26 DIAGNOSIS — C155 Malignant neoplasm of lower third of esophagus: Secondary | ICD-10-CM | POA: Diagnosis not present

## 2021-01-27 ENCOUNTER — Inpatient Hospital Stay (HOSPITAL_BASED_OUTPATIENT_CLINIC_OR_DEPARTMENT_OTHER): Payer: BLUE CROSS/BLUE SHIELD | Admitting: Oncology

## 2021-01-27 ENCOUNTER — Inpatient Hospital Stay: Payer: BLUE CROSS/BLUE SHIELD

## 2021-01-27 ENCOUNTER — Encounter: Payer: Self-pay | Admitting: Oncology

## 2021-01-27 ENCOUNTER — Ambulatory Visit
Admission: RE | Admit: 2021-01-27 | Discharge: 2021-01-27 | Disposition: A | Discharge status: Home / Self Care | Payer: BLUE CROSS/BLUE SHIELD | Source: Ambulatory Visit | Attending: Radiation Oncology | Admitting: Radiation Oncology

## 2021-01-27 VITALS — BP 122/80 | HR 89 | Temp 97.0°F | Resp 18 | Wt 169.4 lb

## 2021-01-27 DIAGNOSIS — M549 Dorsalgia, unspecified: Secondary | ICD-10-CM | POA: Diagnosis not present

## 2021-01-27 DIAGNOSIS — C159 Malignant neoplasm of esophagus, unspecified: Secondary | ICD-10-CM

## 2021-01-27 DIAGNOSIS — R634 Abnormal weight loss: Secondary | ICD-10-CM | POA: Diagnosis not present

## 2021-01-27 DIAGNOSIS — G8929 Other chronic pain: Secondary | ICD-10-CM | POA: Diagnosis not present

## 2021-01-27 DIAGNOSIS — Z79899 Other long term (current) drug therapy: Secondary | ICD-10-CM | POA: Diagnosis not present

## 2021-01-27 DIAGNOSIS — R630 Anorexia: Secondary | ICD-10-CM | POA: Diagnosis not present

## 2021-01-27 DIAGNOSIS — D649 Anemia, unspecified: Secondary | ICD-10-CM | POA: Diagnosis not present

## 2021-01-27 DIAGNOSIS — C155 Malignant neoplasm of lower third of esophagus: Secondary | ICD-10-CM | POA: Diagnosis not present

## 2021-01-27 DIAGNOSIS — Z5111 Encounter for antineoplastic chemotherapy: Secondary | ICD-10-CM | POA: Diagnosis present

## 2021-01-27 LAB — COMPREHENSIVE METABOLIC PANEL
ALT: 18 U/L (ref 0–44)
AST: 22 U/L (ref 15–41)
Albumin: 4.1 g/dL (ref 3.5–5.0)
Alkaline Phosphatase: 59 U/L (ref 38–126)
Anion gap: 9 (ref 5–15)
BUN: 14 mg/dL (ref 6–20)
CO2: 28 mmol/L (ref 22–32)
Calcium: 9.1 mg/dL (ref 8.9–10.3)
Chloride: 100 mmol/L (ref 98–111)
Creatinine, Ser: 0.62 mg/dL (ref 0.61–1.24)
GFR, Estimated: >60 mL/min (ref >60)
Glucose, Bld: 164 mg/dL — ABNORMAL HIGH (ref 70–99)
Potassium: 3.6 mmol/L (ref 3.5–5.1)
Sodium: 137 mmol/L (ref 135–145)
Total Bilirubin: 1 mg/dL (ref 0.3–1.2)
Total Protein: 6.7 g/dL (ref 6.5–8.1)

## 2021-01-27 LAB — CBC WITH DIFFERENTIAL/PLATELET
Abs Immature Granulocytes: 0.03 10*3/uL (ref 0.00–0.07)
Basophils Absolute: 0 10*3/uL (ref 0.0–0.1)
Basophils Relative: 1 %
Eosinophils Absolute: 0.1 10*3/uL (ref 0.0–0.5)
Eosinophils Relative: 3 %
HCT: 41.2 % (ref 39.0–52.0)
Hemoglobin: 14.4 g/dL (ref 13.0–17.0)
Immature Granulocytes: 1 %
Lymphocytes Relative: 10 %
Lymphs Abs: 0.5 10*3/uL — ABNORMAL LOW (ref 0.7–4.0)
MCH: 30.5 pg (ref 26.0–34.0)
MCHC: 35 g/dL (ref 30.0–36.0)
MCV: 87.3 fL (ref 80.0–100.0)
Monocytes Absolute: 0.4 10*3/uL (ref 0.1–1.0)
Monocytes Relative: 8 %
Neutro Abs: 4 10*3/uL (ref 1.7–7.7)
Neutrophils Relative %: 77 %
Platelets: 258 10*3/uL (ref 150–400)
RBC: 4.72 MIL/uL (ref 4.22–5.81)
RDW: 12 % (ref 11.5–15.5)
WBC: 5.2 10*3/uL (ref 4.0–10.5)
nRBC: 0 % (ref 0.0–0.2)

## 2021-01-27 MED ORDER — FAMOTIDINE 20 MG IN NS 100 ML IVPB
20.0000 mg | Freq: Once | INTRAVENOUS | Status: AC
Start: 2021-01-27 — End: 2021-01-27
  Administered 2021-01-27: 20 mg via INTRAVENOUS
  Filled 2021-01-27: qty 20

## 2021-01-27 MED ORDER — SODIUM CHLORIDE 0.9 % IV SOLN
Freq: Once | INTRAVENOUS | Status: AC
  Filled 2021-01-27: qty 250

## 2021-01-27 MED ORDER — HEPARIN SOD (PORK) LOCK FLUSH 100 UNIT/ML IV SOLN
INTRAVENOUS | Status: AC
  Filled 2021-01-27: qty 5

## 2021-01-27 MED ORDER — SODIUM CHLORIDE 0.9% FLUSH
10.0000 mL | INTRAVENOUS | Status: DC | PRN
Start: 2021-01-27 — End: 2021-01-27
  Filled 2021-01-27: qty 10

## 2021-01-27 MED ORDER — PALONOSETRON HCL INJECTION 0.25 MG/5ML
0.2500 mg | Freq: Once | INTRAVENOUS | Status: AC
  Administered 2021-01-27: 0.25 mg via INTRAVENOUS
  Filled 2021-01-27: qty 5

## 2021-01-27 MED ORDER — SODIUM CHLORIDE 0.9 % IV SOLN
10.0000 mg | Freq: Once | INTRAVENOUS | Status: AC
  Administered 2021-01-27: 10 mg via INTRAVENOUS
  Filled 2021-01-27: qty 10

## 2021-01-27 MED ORDER — SODIUM CHLORIDE 0.9 % IV SOLN: 50.0000 mg/m2 | Freq: Once | INTRAVENOUS | Status: DC

## 2021-01-27 MED ORDER — SODIUM CHLORIDE 0.9 % IV SOLN
50.0000 mg/m2 | Freq: Once | INTRAVENOUS | Status: AC
  Administered 2021-01-27: 96 mg via INTRAVENOUS
  Filled 2021-01-27: qty 16

## 2021-01-27 MED ORDER — SODIUM CHLORIDE 0.9 % IV SOLN
300.0000 mg | Freq: Once | INTRAVENOUS | Status: AC
  Administered 2021-01-27: 300 mg via INTRAVENOUS
  Filled 2021-01-27: qty 30

## 2021-01-27 MED ORDER — DIPHENHYDRAMINE HCL 50 MG/ML IJ SOLN
25.0000 mg | Freq: Once | INTRAMUSCULAR | Status: AC
  Administered 2021-01-27: 25 mg via INTRAVENOUS
  Filled 2021-01-27: qty 1

## 2021-01-27 NOTE — Progress Notes (Signed)
Patient denies new problems/concerns today.   °

## 2021-01-27 NOTE — Patient Instructions (Signed)
[facility]  Discharge Instructions: Thank you for choosing [facility] to provide your oncology and hematology care.  If you have a lab appointment with the Cancer Center, please go directly to the Cancer Center and check in at the registration area.  Wear comfortable clothing and clothing appropriate for easy access to any Portacath or PICC line.   We strive to give you quality time with your provider. You may need to reschedule your appointment if you arrive late (15 or more minutes).  Arriving late affects you and other patients whose appointments are after yours.  Also, if you miss three or more appointments without notifying the office, you may be dismissed from the clinic at the provider's discretion.      For prescription refill requests, have your pharmacy contact our office and allow 72 hours for refills to be completed.    Today you received the following chemotherapy and/or immunotherapy agents - paclitaxel, carboplatin      To help prevent nausea and vomiting after your treatment, we encourage you to take your nausea medication as directed.  BELOW ARE SYMPTOMS THAT SHOULD BE REPORTED IMMEDIATELY: *FEVER GREATER THAN 100.4 F (38 C) OR HIGHER *CHILLS OR SWEATING *NAUSEA AND VOMITING THAT IS NOT CONTROLLED WITH YOUR NAUSEA MEDICATION *UNUSUAL SHORTNESS OF BREATH *UNUSUAL BRUISING OR BLEEDING *URINARY PROBLEMS (pain or burning when urinating, or frequent urination) *BOWEL PROBLEMS (unusual diarrhea, constipation, pain near the anus) TENDERNESS IN MOUTH AND THROAT WITH OR WITHOUT PRESENCE OF ULCERS (sore throat, sores in mouth, or a toothache) UNUSUAL RASH, SWELLING OR PAIN  UNUSUAL VAGINAL DISCHARGE OR ITCHING   Items with * indicate a potential emergency and should be followed up as soon as possible or go to the Emergency Department if any problems should occur.  Please show the CHEMOTHERAPY ALERT CARD or IMMUNOTHERAPY ALERT  CARD at check-in to the Emergency Department and triage nurse.  Should you have questions after your visit or need to cancel or reschedule your appointment, please contact [facility] [place] [facility]  [phone] and follow the prompts.  Office hours are 8:00 a.m. to 4:30 p.m. Monday - Friday. Please note that voicemails left after 4:00 p.m. may not be returned until the following business day.  We are closed weekends and major holidays. You have access to a nurse at all times for urgent questions. Please call the main number to the clinic [phone] and follow the prompts.  For any non-urgent questions, you may also contact your provider using MyChart. We now offer e-Visits for anyone 18 and older to request care online for non-urgent symptoms. For details visit [email or web address].   Also download the MyChart app! Go to the app store, search "MyChart", open the app, select [facility], and log in with your MyChart username and password.  Due to Covid, a mask is required upon entering the hospital/clinic. If you do not have a mask, one will be given to you upon arrival. For doctor visits, patients may have 1 support person aged 18 or older with them. For treatment visits, patients cannot have anyone with them due to current Covid guidelines and our immunocompromised population.   Paclitaxel injection What is this medication? PACLITAXEL (PAK li TAX el) is a chemotherapy drug. It targets fast dividing cells, like cancer cells, and causes these cells to die. This medicine is used to treat ovarian cancer, breast cancer, lung cancer, Kaposi's sarcoma, andother cancers. This medicine may be used for other purposes;   ask your health care provider orpharmacist if you have questions. COMMON BRAND NAME(S): Onxol, Taxol What should I tell my care team before I take this medication? They need to know if you have any of these conditions: history of irregular heartbeat liver  disease low blood counts, like low white cell, platelet, or red cell counts lung or breathing disease, like asthma tingling of the fingers or toes, or other nerve disorder an unusual or allergic reaction to paclitaxel, alcohol, polyoxyethylated castor oil, other chemotherapy, other medicines, foods, dyes, or preservatives pregnant or trying to get pregnant breast-feeding How should I use this medication? This drug is given as an infusion into a vein. It is administered in a hospitalor clinic by a specially trained health care professional. Talk to your pediatrician regarding the use of this medicine in children.Special care may be needed. Overdosage: If you think you have taken too much of this medicine contact apoison control center or emergency room at once. NOTE: This medicine is only for you. Do not share this medicine with others. What if I miss a dose? It is important not to miss your dose. Call your doctor or health careprofessional if you are unable to keep an appointment. What may interact with this medication? Do not take this medicine with any of the following medications: live virus vaccines This medicine may also interact with the following medications: antiviral medicines for hepatitis, HIV or AIDS certain antibiotics like erythromycin and clarithromycin certain medicines for fungal infections like ketoconazole and itraconazole certain medicines for seizures like carbamazepine, phenobarbital, phenytoin gemfibrozil nefazodone rifampin St. John's wort This list may not describe all possible interactions. Give your health care provider a list of all the medicines, herbs, non-prescription drugs, or dietary supplements you use. Also tell them if you smoke, drink alcohol, or use illegaldrugs. Some items may interact with your medicine. What should I watch for while using this medication? Your condition will be monitored carefully while you are receiving this medicine. You will  need important blood work done while you are taking thismedicine. This medicine can cause serious allergic reactions. To reduce your risk you will need to take other medicine(s) before treatment with this medicine. If you experience allergic reactions like skin rash, itching or hives, swelling of theface, lips, or tongue, tell your doctor or health care professional right away. In some cases, you may be given additional medicines to help with side effects.Follow all directions for their use. This drug may make you feel generally unwell. This is not uncommon, as chemotherapy can affect healthy cells as well as cancer cells. Report any side effects. Continue your course of treatment even though you feel ill unless yourdoctor tells you to stop. Call your doctor or health care professional for advice if you get a fever, chills or sore throat, or other symptoms of a cold or flu. Do not treat yourself. This drug decreases your body's ability to fight infections. Try toavoid being around people who are sick. This medicine may increase your risk to bruise or bleed. Call your doctor orhealth care professional if you notice any unusual bleeding. Be careful brushing and flossing your teeth or using a toothpick because you may get an infection or bleed more easily. If you have any dental work done,tell your dentist you are receiving this medicine. Avoid taking products that contain aspirin, acetaminophen, ibuprofen, naproxen, or ketoprofen unless instructed by your doctor. These medicines may hide afever. Do not become pregnant while taking this medicine. Women should inform their   doctor if they wish to become pregnant or think they might be pregnant. There is a potential for serious side effects to an unborn child. Talk to your health care professional or pharmacist for more information. Do not breast-feed aninfant while taking this medicine. Men are advised not to father a child while receiving this medicine. This  product may contain alcohol. Ask your pharmacist or healthcare provider if this medicine contains alcohol. Be sure to tell all healthcare providers you are taking this medicine. Certain medicines, like metronidazole and disulfiram, can cause an unpleasant reaction when taken with alcohol. The reaction includes flushing, headache, nausea, vomiting, sweating, and increased thirst. Thereaction can last from 30 minutes to several hours. What side effects may I notice from receiving this medication? Side effects that you should report to your doctor or health care professionalas soon as possible: allergic reactions like skin rash, itching or hives, swelling of the face, lips, or tongue breathing problems changes in vision fast, irregular heartbeat high or low blood pressure mouth sores pain, tingling, numbness in the hands or feet signs of decreased platelets or bleeding - bruising, pinpoint red spots on the skin, black, tarry stools, blood in the urine signs of decreased red blood cells - unusually weak or tired, feeling faint or lightheaded, falls signs of infection - fever or chills, cough, sore throat, pain or difficulty passing urine signs and symptoms of liver injury like dark yellow or brown urine; general ill feeling or flu-like symptoms; light-colored stools; loss of appetite; nausea; right upper belly pain; unusually weak or tired; yellowing of the eyes or skin swelling of the ankles, feet, hands unusually slow heartbeat Side effects that usually do not require medical attention (report to yourdoctor or health care professional if they continue or are bothersome): diarrhea hair loss loss of appetite muscle or joint pain nausea, vomiting pain, redness, or irritation at site where injected tiredness This list may not describe all possible side effects. Call your doctor for medical advice about side effects. You may report side effects to FDA at1-800-FDA-1088. Where should I keep my  medication? This drug is given in a hospital or clinic and will not be stored at home. NOTE: This sheet is a summary. It may not cover all possible information. If you have questions about this medicine, talk to your doctor, pharmacist, orhealth care provider.  2022 Elsevier/Gold Standard (2019-05-15 13:37:23)  Carboplatin injection What is this medication? CARBOPLATIN (KAR boe pla tin) is a chemotherapy drug. It targets fast dividing cells, like cancer cells, and causes these cells to die. This medicine is usedto treat ovarian cancer and many other cancers. This medicine may be used for other purposes; ask your health care provider orpharmacist if you have questions. COMMON BRAND NAME(S): Paraplatin What should I tell my care team before I take this medication? They need to know if you have any of these conditions: blood disorders hearing problems kidney disease recent or ongoing radiation therapy an unusual or allergic reaction to carboplatin, cisplatin, other chemotherapy, other medicines, foods, dyes, or preservatives pregnant or trying to get pregnant breast-feeding How should I use this medication? This drug is usually given as an infusion into a vein. It is administered in ahospital or clinic by a specially trained health care professional. Talk to your pediatrician regarding the use of this medicine in children.Special care may be needed. Overdosage: If you think you have taken too much of this medicine contact apoison control center or emergency room at once. NOTE: This medicine   is only for you. Do not share this medicine with others. What if I miss a dose? It is important not to miss a dose. Call your doctor or health careprofessional if you are unable to keep an appointment. What may interact with this medication? medicines for seizures medicines to increase blood counts like filgrastim, pegfilgrastim, sargramostim some antibiotics like amikacin, gentamicin, neomycin,  streptomycin, tobramycin vaccines Talk to your doctor or health care professional before taking any of thesemedicines: acetaminophen aspirin ibuprofen ketoprofen naproxen This list may not describe all possible interactions. Give your health care provider a list of all the medicines, herbs, non-prescription drugs, or dietary supplements you use. Also tell them if you smoke, drink alcohol, or use illegaldrugs. Some items may interact with your medicine. What should I watch for while using this medication? Your condition will be monitored carefully while you are receiving this medicine. You will need important blood work done while you are taking thismedicine. This drug may make you feel generally unwell. This is not uncommon, as chemotherapy can affect healthy cells as well as cancer cells. Report any side effects. Continue your course of treatment even though you feel ill unless yourdoctor tells you to stop. In some cases, you may be given additional medicines to help with side effects.Follow all directions for their use. Call your doctor or health care professional for advice if you get a fever, chills or sore throat, or other symptoms of a cold or flu. Do not treat yourself. This drug decreases your body's ability to fight infections. Try toavoid being around people who are sick. This medicine may increase your risk to bruise or bleed. Call your doctor orhealth care professional if you notice any unusual bleeding. Be careful brushing and flossing your teeth or using a toothpick because you may get an infection or bleed more easily. If you have any dental work done,tell your dentist you are receiving this medicine. Avoid taking products that contain aspirin, acetaminophen, ibuprofen, naproxen, or ketoprofen unless instructed by your doctor. These medicines may hide afever. Do not become pregnant while taking this medicine. Women should inform their doctor if they wish to become pregnant or think  they might be pregnant. There is a potential for serious side effects to an unborn child. Talk to your health care professional or pharmacist for more information. Do not breast-feed aninfant while taking this medicine. What side effects may I notice from receiving this medication? Side effects that you should report to your doctor or health care professionalas soon as possible: allergic reactions like skin rash, itching or hives, swelling of the face, lips, or tongue signs of infection - fever or chills, cough, sore throat, pain or difficulty passing urine signs of decreased platelets or bleeding - bruising, pinpoint red spots on the skin, black, tarry stools, nosebleeds signs of decreased red blood cells - unusually weak or tired, fainting spells, lightheadedness breathing problems changes in hearing changes in vision chest pain high blood pressure low blood counts - This drug may decrease the number of white blood cells, red blood cells and platelets. You may be at increased risk for infections and bleeding. nausea and vomiting pain, swelling, redness or irritation at the injection site pain, tingling, numbness in the hands or feet problems with balance, talking, walking trouble passing urine or change in the amount of urine Side effects that usually do not require medical attention (report to yourdoctor or health care professional if they continue or are bothersome): hair loss loss of appetite metallic   taste in the mouth or changes in taste This list may not describe all possible side effects. Call your doctor for medical advice about side effects. You may report side effects to FDA at1-800-FDA-1088. Where should I keep my medication? This drug is given in a hospital or clinic and will not be stored at home. NOTE: This sheet is a summary. It may not cover all possible information. If you have questions about this medicine, talk to your doctor, pharmacist, orhealth care provider.  2022  Elsevier/Gold Standard (2007-09-18 14:38:05)  

## 2021-01-28 ENCOUNTER — Telehealth: Payer: Self-pay | Admitting: *Deleted

## 2021-01-28 ENCOUNTER — Ambulatory Visit: Payer: BLUE CROSS/BLUE SHIELD

## 2021-01-28 ENCOUNTER — Ambulatory Visit
Admission: RE | Admit: 2021-01-28 | Discharge: 2021-01-28 | Disposition: A | Discharge status: Home / Self Care | Payer: BLUE CROSS/BLUE SHIELD | Source: Ambulatory Visit | Attending: Radiation Oncology | Admitting: Radiation Oncology

## 2021-01-28 DIAGNOSIS — C155 Malignant neoplasm of lower third of esophagus: Secondary | ICD-10-CM | POA: Diagnosis not present

## 2021-01-28 NOTE — Telephone Encounter (Signed)
Called and  spoke with [facility]. She was able to speak with surgeon and they want to avoid feeding tube until after surgery. They are planning EGD with dilation tomorrow.

## 2021-01-28 NOTE — Telephone Encounter (Signed)
Nicholas Leblanc patient daughter called stating that patient is in hospital at [facility] and that [facility] is supposed to be putting in a feeding tube, but they are not sure they want to do so. She is asking for someone to call her to discuss this

## 2021-01-29 ENCOUNTER — Ambulatory Visit
Admission: RE | Admit: 2021-01-29 | Discharge: 2021-01-29 | Disposition: A | Discharge status: Home / Self Care | Payer: BLUE CROSS/BLUE SHIELD | Source: Ambulatory Visit | Attending: Radiation Oncology | Admitting: Radiation Oncology

## 2021-01-29 DIAGNOSIS — C155 Malignant neoplasm of lower third of esophagus: Secondary | ICD-10-CM | POA: Diagnosis not present

## 2021-01-29 NOTE — Progress Notes (Signed)
[facility]  [phone] [phone]) [phone]  ID: Nicholas Leblanc OB: 1966-02-27  MR#: [id number]  [id number]  Patient Care Team: Lesly Rubenstein, MD as PCP - General (Gastroenterology) Clent Jacks, RN as Oncology Nurse Navigator  CHIEF COMPLAINT: Stage III esophageal adenocarcinoma.  INTERVAL HISTORY: Patient returns to clinic today for further evaluation and consideration of cycle 4 of weekly carboplatin patient returns to clinic today for further evaluation and consideration of cycle 3 of weekly carboplatinum and daily XRT.  He did not have J-tube placement at [facility] last week, but will have esophageal dilatation tomorrow.  He is tolerating his treatments well without significant side effects.  He continues to have dysphagia and difficulty swallowing.   He has no neurologic complaints.  He denies any recent fevers or illnesses.  He denies any chest pain, shortness of breath, cough, or hemoptysis.  He denies any nausea, vomiting, constipation, or diarrhea.  He has no urinary complaints.  Patient offers no further specific complaints today.  REVIEW OF SYSTEMS:   Review of Systems  Constitutional:  Positive for malaise/fatigue and weight loss. Negative for fever.  Respiratory: Negative.  Negative for cough, hemoptysis and shortness of breath.   Cardiovascular: Negative.  Negative for chest pain and leg swelling.  Gastrointestinal:  Negative for abdominal pain, heartburn and nausea.  Genitourinary: Negative.  Negative for dysuria.  Musculoskeletal:  Positive for back pain.  Skin: Negative.  Negative for rash.  Neurological:  Positive for weakness. Negative for dizziness, focal weakness and headaches.  Psychiatric/Behavioral: Negative.  The patient is not nervous/anxious.    As per HPI. Otherwise, a complete review of systems is negative.  PAST MEDICAL HISTORY: Past Medical History:  Diagnosis Date   Diabetes mellitus without  complication ([facility])    Hypertension     PAST SURGICAL HISTORY: No past surgical history on file.  FAMILY HISTORY: History reviewed. No pertinent family history.  ADVANCED DIRECTIVES (Y/N):  N  HEALTH MAINTENANCE: Social History   Tobacco Use   Smoking status: Every Day    Packs/day: 1.00    Types: Cigarettes   Smokeless tobacco: Never  Vaping Use   Vaping Use: Every day  Substance Use Topics   Alcohol use: Never   Drug use: Yes    Types: Marijuana     Colonoscopy:  PAP:  Bone density:  Lipid panel:  Allergies  Allergen Reactions   Bee Venom Swelling    Doesn't require Epi-Pen    Current Outpatient Medications  Medication Sig Dispense Refill   FLUoxetine (PROZAC) 20 MG capsule Take 20 mg by mouth daily.     HYDROmorphone (DILAUDID) 8 MG tablet Take by mouth.     ibuprofen (ADVIL) 800 MG tablet Take 800 mg by mouth every 8 (eight) hours as needed.     ondansetron (ZOFRAN) 8 MG tablet Take 1 tablet (8 mg total) by mouth 2 (two) times daily as needed for refractory nausea / vomiting. Start on day 3 after chemo. 60 tablet 1   ondansetron (ZOFRAN-ODT) 8 MG disintegrating tablet Take 8 mg by mouth every 8 (eight) hours as needed for nausea or vomiting.     oxyCODONE-acetaminophen (PERCOCET) 10-325 MG tablet Take 1 tablet by mouth every 4 (four) hours as needed.     prochlorperazine (COMPAZINE) 10 MG tablet Take 1 tablet (10 mg total) by mouth every 6 (six) hours as needed for nausea or vomiting. 30 tablet 2   lisinopril (ZESTRIL) 5 MG tablet Take 5 mg  by mouth daily. (Patient not taking: Reported on 02/03/2021)     metFORMIN (GLUCOPHAGE) 500 MG tablet Take 500 mg by mouth 2 (two) times daily with a meal. (Patient not taking: Reported on 02/03/2021)     omeprazole (PRILOSEC) 40 MG capsule Take 1 capsule by mouth daily. (Patient not taking: Reported on 02/03/2021)     simvastatin (ZOCOR) 10 MG tablet Take 10 mg by mouth daily. (Patient not taking: Reported on 02/03/2021)     No  current facility-administered medications for this visit.    OBJECTIVE: Vitals:   02/03/21 1004  BP: 126/89  Pulse: 98  Resp: 18  Temp: (!) 96.4 F (35.8 C)      Body mass index is 24.37 kg/m.    ECOG FS:1 - Symptomatic but completely ambulatory  General: Thin, no acute distress. Eyes: Pink conjunctiva, anicteric sclera. HEENT: Normocephalic, moist mucous membranes. Lungs: No audible wheezing or coughing. Heart: Regular rate and rhythm. Abdomen: Soft, nontender, no obvious distention. Musculoskeletal: No edema, cyanosis, or clubbing. Neuro: Alert, answering all questions appropriately. Cranial nerves grossly intact. Skin: No rashes or petechiae noted. Psych: Normal affect.   LAB RESULTS:  Lab Results  Component Value Date   NA 136 02/03/2021   K 3.3 (L) 02/03/2021   CL 101 02/03/2021   CO2 25 02/03/2021   GLUCOSE 130 (H) 02/03/2021   BUN 11 02/03/2021   CREATININE 0.61 02/03/2021   CALCIUM 8.9 02/03/2021   PROT 6.6 02/03/2021   ALBUMIN 4.1 02/03/2021   AST 19 02/03/2021   ALT 15 02/03/2021   ALKPHOS 50 02/03/2021   BILITOT 1.4 (H) 02/03/2021   GFRNONAA >60 02/03/2021    Lab Results  Component Value Date   WBC 4.1 02/03/2021   NEUTROABS 3.1 02/03/2021   HGB 13.7 02/03/2021   HCT 39.1 02/03/2021   MCV 87.9 02/03/2021   PLT 236 02/03/2021     STUDIES: No results found.  ASSESSMENT: Stage III esophageal adenocarcinoma.  PLAN:    1. Stage III esophageal adenocarcinoma: EGD and biopsy completed in [place] confirms adenocarcinoma.  Recent EUS and CT scan confirmed stage of disease.  PET scan results from December 31, 2020 reviewed independently with hypermetabolic distal esophageal mass, without local regional lymphadenopathy.  Patient will benefit from neoadjuvant chemotherapy with weekly carboplatinum and Taxol along with daily XRT.  This will be followed by partial esophagectomy at [facility].  Proceed with cycle 4 of weekly carboplatinum and Taxol  today.  Continue daily XRT completing treatment on February 23, 2021.  Return to clinic in 1 week for further evaluation and consideration of cycle 5.     2.  Back pain: Chronic and unchanged. 3.  Weight loss: Patient has an appointment at [facility] tomorrow morning for esophageal dilatation. 4.  Weakness/fatigue: Return to clinic tomorrow and Monday for IV fluids. 5.  Hypokalemia: Patient will receive 20 mEq IV potassium along with his fluids tomorrow. 6.  Hyperbilirubinemia: Mild, monitor.   Patient expressed understanding and was in agreement with this plan. He also understands that He can call clinic at any time with any questions, concerns, or complaints.   Cancer Staging Esophageal adenocarcinoma [facility]) Staging form: Esophagus - Adenocarcinoma, AJCC 8th Edition - Clinical stage from 12/04/2020: Stage III (cT3, cN1, cM0) - Signed by [name], MD on 12/04/2020 Stage prefix: Initial diagnosis  [name], MD   02/04/2021 6:40 AM

## 2021-02-01 ENCOUNTER — Encounter: Payer: Self-pay | Admitting: Oncology

## 2021-02-01 ENCOUNTER — Ambulatory Visit
Admission: RE | Admit: 2021-02-01 | Discharge: 2021-02-01 | Disposition: A | Discharge status: Home / Self Care | Payer: BLUE CROSS/BLUE SHIELD | Source: Ambulatory Visit | Attending: Radiation Oncology | Admitting: Radiation Oncology

## 2021-02-01 DIAGNOSIS — C155 Malignant neoplasm of lower third of esophagus: Secondary | ICD-10-CM | POA: Diagnosis not present

## 2021-02-02 ENCOUNTER — Ambulatory Visit
Admission: RE | Admit: 2021-02-02 | Discharge: 2021-02-02 | Disposition: A | Discharge status: Home / Self Care | Payer: BLUE CROSS/BLUE SHIELD | Source: Ambulatory Visit | Attending: Radiation Oncology | Admitting: Radiation Oncology

## 2021-02-02 ENCOUNTER — Inpatient Hospital Stay: Payer: BLUE CROSS/BLUE SHIELD

## 2021-02-02 DIAGNOSIS — C155 Malignant neoplasm of lower third of esophagus: Secondary | ICD-10-CM | POA: Diagnosis not present

## 2021-02-02 NOTE — Telephone Encounter (Signed)
Pt scheduled for IVF on 02/04/21

## 2021-02-03 ENCOUNTER — Ambulatory Visit
Admission: RE | Admit: 2021-02-03 | Discharge: 2021-02-03 | Disposition: A | Discharge status: Home / Self Care | Payer: BLUE CROSS/BLUE SHIELD | Source: Ambulatory Visit | Attending: Radiation Oncology | Admitting: Radiation Oncology

## 2021-02-03 ENCOUNTER — Inpatient Hospital Stay: Payer: BLUE CROSS/BLUE SHIELD

## 2021-02-03 ENCOUNTER — Encounter: Payer: Self-pay | Admitting: Oncology

## 2021-02-03 ENCOUNTER — Inpatient Hospital Stay (HOSPITAL_BASED_OUTPATIENT_CLINIC_OR_DEPARTMENT_OTHER): Payer: BLUE CROSS/BLUE SHIELD | Admitting: Oncology

## 2021-02-03 VITALS — BP 126/89 | HR 98 | Temp 96.4°F | Resp 18 | Wt 165.0 lb

## 2021-02-03 DIAGNOSIS — C159 Malignant neoplasm of esophagus, unspecified: Secondary | ICD-10-CM

## 2021-02-03 DIAGNOSIS — C155 Malignant neoplasm of lower third of esophagus: Secondary | ICD-10-CM | POA: Diagnosis not present

## 2021-02-03 LAB — CBC WITH DIFFERENTIAL/PLATELET
Abs Immature Granulocytes: 0.03 10*3/uL (ref 0.00–0.07)
Basophils Absolute: 0 10*3/uL (ref 0.0–0.1)
Basophils Relative: 1 %
Eosinophils Absolute: 0.1 10*3/uL (ref 0.0–0.5)
Eosinophils Relative: 2 %
HCT: 39.1 % (ref 39.0–52.0)
Hemoglobin: 13.7 g/dL (ref 13.0–17.0)
Immature Granulocytes: 1 %
Lymphocytes Relative: 13 %
Lymphs Abs: 0.5 10*3/uL — ABNORMAL LOW (ref 0.7–4.0)
MCH: 30.8 pg (ref 26.0–34.0)
MCHC: 35 g/dL (ref 30.0–36.0)
MCV: 87.9 fL (ref 80.0–100.0)
Monocytes Absolute: 0.3 10*3/uL (ref 0.1–1.0)
Monocytes Relative: 8 %
Neutro Abs: 3.1 10*3/uL (ref 1.7–7.7)
Neutrophils Relative %: 75 %
Platelets: 236 10*3/uL (ref 150–400)
RBC: 4.45 MIL/uL (ref 4.22–5.81)
RDW: 12.5 % (ref 11.5–15.5)
WBC: 4.1 10*3/uL (ref 4.0–10.5)
nRBC: 0 % (ref 0.0–0.2)

## 2021-02-03 LAB — COMPREHENSIVE METABOLIC PANEL
ALT: 15 U/L (ref 0–44)
AST: 19 U/L (ref 15–41)
Albumin: 4.1 g/dL (ref 3.5–5.0)
Alkaline Phosphatase: 50 U/L (ref 38–126)
Anion gap: 10 (ref 5–15)
BUN: 11 mg/dL (ref 6–20)
CO2: 25 mmol/L (ref 22–32)
Calcium: 8.9 mg/dL (ref 8.9–10.3)
Chloride: 101 mmol/L (ref 98–111)
Creatinine, Ser: 0.61 mg/dL (ref 0.61–1.24)
GFR, Estimated: >60 mL/min (ref >60)
Glucose, Bld: 130 mg/dL — ABNORMAL HIGH (ref 70–99)
Potassium: 3.3 mmol/L — ABNORMAL LOW (ref 3.5–5.1)
Sodium: 136 mmol/L (ref 135–145)
Total Bilirubin: 1.4 mg/dL — ABNORMAL HIGH (ref 0.3–1.2)
Total Protein: 6.6 g/dL (ref 6.5–8.1)

## 2021-02-03 MED ORDER — SODIUM CHLORIDE 0.9 % IV SOLN
300.0000 mg | Freq: Once | INTRAVENOUS | Status: AC
  Administered 2021-02-03: 300 mg via INTRAVENOUS
  Filled 2021-02-03: qty 30

## 2021-02-03 MED ORDER — HEPARIN SOD (PORK) LOCK FLUSH 100 UNIT/ML IV SOLN
INTRAVENOUS | Status: AC
  Filled 2021-02-03: qty 5

## 2021-02-03 MED ORDER — SODIUM CHLORIDE 0.9 % IV SOLN
10.0000 mg | Freq: Once | INTRAVENOUS | Status: AC
  Administered 2021-02-03: 10 mg via INTRAVENOUS
  Filled 2021-02-03: qty 10

## 2021-02-03 MED ORDER — FAMOTIDINE 20 MG IN NS 100 ML IVPB
20.0000 mg | Freq: Once | INTRAVENOUS | Status: AC
  Administered 2021-02-03: 20 mg via INTRAVENOUS
  Filled 2021-02-03: qty 20
  Filled 2021-02-03: qty 100

## 2021-02-03 MED ORDER — DIPHENHYDRAMINE HCL 50 MG/ML IJ SOLN
25.0000 mg | Freq: Once | INTRAMUSCULAR | Status: AC
  Administered 2021-02-03: 25 mg via INTRAVENOUS
  Filled 2021-02-03: qty 1

## 2021-02-03 MED ORDER — SODIUM CHLORIDE 0.9 % IV SOLN
50.0000 mg/m2 | Freq: Once | INTRAVENOUS | Status: AC
  Administered 2021-02-03: 96 mg via INTRAVENOUS
  Filled 2021-02-03: qty 16

## 2021-02-03 MED ORDER — PALONOSETRON HCL INJECTION 0.25 MG/5ML
0.2500 mg | Freq: Once | INTRAVENOUS | Status: AC
  Administered 2021-02-03: 0.25 mg via INTRAVENOUS
  Filled 2021-02-03: qty 5

## 2021-02-03 MED ORDER — SODIUM CHLORIDE 0.9 % IV SOLN
Freq: Once | INTRAVENOUS | Status: AC
  Filled 2021-02-03: qty 250

## 2021-02-03 NOTE — Progress Notes (Signed)
Nutrition Follow-up:    Patient with esophageal adenocarcinoma, followed by Dr Grayland Ormond.  Patient receiving concurrent chemotherapy and radiation therapy.    Met with patient during infusion.  Patient reports that he is planning EGD at [facility] tomorrow.  Oral intake is limited to liquids (soups, gatorade, water, pudding, juicing). He said he was able to keep a bite of jerky down.    [facility] not planning on placing J-tube until after surgery. EGD planned for tomorrow  Medications: reviewed  Labs: reviewed  Anthropometrics:   Weight 165 lb today  Weight 171 lb 3.2 oz on 7/27 215 lb 11.2 oz on 11/19/20    NUTRITION DIAGNOSIS: Inadequate oral intake continues   INTERVENTION:  Hopefully patient to see some improvement with intake after EGD.  Continue high calorie, high protein liquids    MONITORING, EVALUATION, GOAL: weight trends, intake   NEXT VISIT: Wednesday, August 17 during infusion  Stina Gane B. Zenia Resides, [place], [place] Registered Dietitian [phone] (mobile)

## 2021-02-03 NOTE — Patient Instructions (Signed)
[facility]  Discharge Instructions: Thank you for choosing [facility] to provide your oncology and hematology care.  If you have a lab appointment with the Cancer Center, please go directly to the Cancer Center and check in at the registration area.  Wear comfortable clothing and clothing appropriate for easy access to any Portacath or PICC line.   We strive to give you quality time with your provider. You may need to reschedule your appointment if you arrive late (15 or more minutes).  Arriving late affects you and other patients whose appointments are after yours.  Also, if you miss three or more appointments without notifying the office, you may be dismissed from the clinic at the provider's discretion.      For prescription refill requests, have your pharmacy contact our office and allow 72 hours for refills to be completed.    Today you received the following chemotherapy and/or immunotherapy agents TAXOL and CARBOPLATIN      To help prevent nausea and vomiting after your treatment, we encourage you to take your nausea medication as directed.  BELOW ARE SYMPTOMS THAT SHOULD BE REPORTED IMMEDIATELY: *FEVER GREATER THAN 100.4 F (38 C) OR HIGHER *CHILLS OR SWEATING *NAUSEA AND VOMITING THAT IS NOT CONTROLLED WITH YOUR NAUSEA MEDICATION *UNUSUAL SHORTNESS OF BREATH *UNUSUAL BRUISING OR BLEEDING *URINARY PROBLEMS (pain or burning when urinating, or frequent urination) *BOWEL PROBLEMS (unusual diarrhea, constipation, pain near the anus) TENDERNESS IN MOUTH AND THROAT WITH OR WITHOUT PRESENCE OF ULCERS (sore throat, sores in mouth, or a toothache) UNUSUAL RASH, SWELLING OR PAIN  UNUSUAL VAGINAL DISCHARGE OR ITCHING   Items with * indicate a potential emergency and should be followed up as soon as possible or go to the Emergency Department if any problems should occur.  Please show the CHEMOTHERAPY ALERT CARD or IMMUNOTHERAPY ALERT CARD  at check-in to the Emergency Department and triage nurse.  Should you have questions after your visit or need to cancel or reschedule your appointment, please contact [facility]  [phone] and follow the prompts.  Office hours are 8:00 a.m. to 4:30 p.m. Monday - Friday. Please note that voicemails left after 4:00 p.m. may not be returned until the following business day.  We are closed weekends and major holidays. You have access to a nurse at all times for urgent questions. Please call the main number to the clinic [phone] and follow the prompts.  For any non-urgent questions, you may also contact your provider using MyChart. We now offer e-Visits for anyone 18 and older to request care online for non-urgent symptoms. For details visit [email or web address].   Also download the MyChart app! Go to the app store, search "MyChart", open the app, select [facility], and log in with your MyChart username and password.  Due to Covid, a mask is required upon entering the hospital/clinic. If you do not have a mask, one will be given to you upon arrival. For doctor visits, patients may have 1 support person aged 18 or older with them. For treatment visits, patients cannot have anyone with them due to current Covid guidelines and our immunocompromised population.   Paclitaxel injection What is this medication? PACLITAXEL (PAK li TAX el) is a chemotherapy drug. It targets fast dividing cells, like cancer cells, and causes these cells to die. This medicine is used to treat ovarian cancer, breast cancer, lung cancer, Kaposi's sarcoma, andother cancers. This medicine may be used for other purposes;   ask your health care provider orpharmacist if you have questions. COMMON BRAND NAME(S): Onxol, Taxol What should I tell my care team before I take this medication? They need to know if you have any of these conditions: history of irregular heartbeat liver disease low  blood counts, like low white cell, platelet, or red cell counts lung or breathing disease, like asthma tingling of the fingers or toes, or other nerve disorder an unusual or allergic reaction to paclitaxel, alcohol, polyoxyethylated castor oil, other chemotherapy, other medicines, foods, dyes, or preservatives pregnant or trying to get pregnant breast-feeding How should I use this medication? This drug is given as an infusion into a vein. It is administered in a hospitalor clinic by a specially trained health care professional. Talk to your pediatrician regarding the use of this medicine in children.Special care may be needed. Overdosage: If you think you have taken too much of this medicine contact apoison control center or emergency room at once. NOTE: This medicine is only for you. Do not share this medicine with others. What if I miss a dose? It is important not to miss your dose. Call your doctor or health careprofessional if you are unable to keep an appointment. What may interact with this medication? Do not take this medicine with any of the following medications: live virus vaccines This medicine may also interact with the following medications: antiviral medicines for hepatitis, HIV or AIDS certain antibiotics like erythromycin and clarithromycin certain medicines for fungal infections like ketoconazole and itraconazole certain medicines for seizures like carbamazepine, phenobarbital, phenytoin gemfibrozil nefazodone rifampin St. John's wort This list may not describe all possible interactions. Give your health care provider a list of all the medicines, herbs, non-prescription drugs, or dietary supplements you use. Also tell them if you smoke, drink alcohol, or use illegaldrugs. Some items may interact with your medicine. What should I watch for while using this medication? Your condition will be monitored carefully while you are receiving this medicine. You will need important  blood work done while you are taking thismedicine. This medicine can cause serious allergic reactions. To reduce your risk you will need to take other medicine(s) before treatment with this medicine. If you experience allergic reactions like skin rash, itching or hives, swelling of theface, lips, or tongue, tell your doctor or health care professional right away. In some cases, you may be given additional medicines to help with side effects.Follow all directions for their use. This drug may make you feel generally unwell. This is not uncommon, as chemotherapy can affect healthy cells as well as cancer cells. Report any side effects. Continue your course of treatment even though you feel ill unless yourdoctor tells you to stop. Call your doctor or health care professional for advice if you get a fever, chills or sore throat, or other symptoms of a cold or flu. Do not treat yourself. This drug decreases your body's ability to fight infections. Try toavoid being around people who are sick. This medicine may increase your risk to bruise or bleed. Call your doctor orhealth care professional if you notice any unusual bleeding. Be careful brushing and flossing your teeth or using a toothpick because you may get an infection or bleed more easily. If you have any dental work done,tell your dentist you are receiving this medicine. Avoid taking products that contain aspirin, acetaminophen, ibuprofen, naproxen, or ketoprofen unless instructed by your doctor. These medicines may hide afever. Do not become pregnant while taking this medicine. Women should inform their   doctor if they wish to become pregnant or think they might be pregnant. There is a potential for serious side effects to an unborn child. Talk to your health care professional or pharmacist for more information. Do not breast-feed aninfant while taking this medicine. Men are advised not to father a child while receiving this medicine. This product may  contain alcohol. Ask your pharmacist or healthcare provider if this medicine contains alcohol. Be sure to tell all healthcare providers you are taking this medicine. Certain medicines, like metronidazole and disulfiram, can cause an unpleasant reaction when taken with alcohol. The reaction includes flushing, headache, nausea, vomiting, sweating, and increased thirst. Thereaction can last from 30 minutes to several hours. What side effects may I notice from receiving this medication? Side effects that you should report to your doctor or health care professionalas soon as possible: allergic reactions like skin rash, itching or hives, swelling of the face, lips, or tongue breathing problems changes in vision fast, irregular heartbeat high or low blood pressure mouth sores pain, tingling, numbness in the hands or feet signs of decreased platelets or bleeding - bruising, pinpoint red spots on the skin, black, tarry stools, blood in the urine signs of decreased red blood cells - unusually weak or tired, feeling faint or lightheaded, falls signs of infection - fever or chills, cough, sore throat, pain or difficulty passing urine signs and symptoms of liver injury like dark yellow or brown urine; general ill feeling or flu-like symptoms; light-colored stools; loss of appetite; nausea; right upper belly pain; unusually weak or tired; yellowing of the eyes or skin swelling of the ankles, feet, hands unusually slow heartbeat Side effects that usually do not require medical attention (report to yourdoctor or health care professional if they continue or are bothersome): diarrhea hair loss loss of appetite muscle or joint pain nausea, vomiting pain, redness, or irritation at site where injected tiredness This list may not describe all possible side effects. Call your doctor for medical advice about side effects. You may report side effects to FDA at1-800-FDA-1088. Where should I keep my medication? This  drug is given in a hospital or clinic and will not be stored at home. NOTE: This sheet is a summary. It may not cover all possible information. If you have questions about this medicine, talk to your doctor, pharmacist, orhealth care provider.  2022 Elsevier/Gold Standard (2019-05-15 13:37:23)  Carboplatin injection What is this medication? CARBOPLATIN (KAR boe pla tin) is a chemotherapy drug. It targets fast dividing cells, like cancer cells, and causes these cells to die. This medicine is usedto treat ovarian cancer and many other cancers. This medicine may be used for other purposes; ask your health care provider orpharmacist if you have questions. COMMON BRAND NAME(S): Paraplatin What should I tell my care team before I take this medication? They need to know if you have any of these conditions: blood disorders hearing problems kidney disease recent or ongoing radiation therapy an unusual or allergic reaction to carboplatin, cisplatin, other chemotherapy, other medicines, foods, dyes, or preservatives pregnant or trying to get pregnant breast-feeding How should I use this medication? This drug is usually given as an infusion into a vein. It is administered in ahospital or clinic by a specially trained health care professional. Talk to your pediatrician regarding the use of this medicine in children.Special care may be needed. Overdosage: If you think you have taken too much of this medicine contact apoison control center or emergency room at once. NOTE: This medicine   is only for you. Do not share this medicine with others. What if I miss a dose? It is important not to miss a dose. Call your doctor or health careprofessional if you are unable to keep an appointment. What may interact with this medication? medicines for seizures medicines to increase blood counts like filgrastim, pegfilgrastim, sargramostim some antibiotics like amikacin, gentamicin, neomycin, streptomycin,  tobramycin vaccines Talk to your doctor or health care professional before taking any of thesemedicines: acetaminophen aspirin ibuprofen ketoprofen naproxen This list may not describe all possible interactions. Give your health care provider a list of all the medicines, herbs, non-prescription drugs, or dietary supplements you use. Also tell them if you smoke, drink alcohol, or use illegaldrugs. Some items may interact with your medicine. What should I watch for while using this medication? Your condition will be monitored carefully while you are receiving this medicine. You will need important blood work done while you are taking thismedicine. This drug may make you feel generally unwell. This is not uncommon, as chemotherapy can affect healthy cells as well as cancer cells. Report any side effects. Continue your course of treatment even though you feel ill unless yourdoctor tells you to stop. In some cases, you may be given additional medicines to help with side effects.Follow all directions for their use. Call your doctor or health care professional for advice if you get a fever, chills or sore throat, or other symptoms of a cold or flu. Do not treat yourself. This drug decreases your body's ability to fight infections. Try toavoid being around people who are sick. This medicine may increase your risk to bruise or bleed. Call your doctor orhealth care professional if you notice any unusual bleeding. Be careful brushing and flossing your teeth or using a toothpick because you may get an infection or bleed more easily. If you have any dental work done,tell your dentist you are receiving this medicine. Avoid taking products that contain aspirin, acetaminophen, ibuprofen, naproxen, or ketoprofen unless instructed by your doctor. These medicines may hide afever. Do not become pregnant while taking this medicine. Women should inform their doctor if they wish to become pregnant or think they might be  pregnant. There is a potential for serious side effects to an unborn child. Talk to your health care professional or pharmacist for more information. Do not breast-feed aninfant while taking this medicine. What side effects may I notice from receiving this medication? Side effects that you should report to your doctor or health care professionalas soon as possible: allergic reactions like skin rash, itching or hives, swelling of the face, lips, or tongue signs of infection - fever or chills, cough, sore throat, pain or difficulty passing urine signs of decreased platelets or bleeding - bruising, pinpoint red spots on the skin, black, tarry stools, nosebleeds signs of decreased red blood cells - unusually weak or tired, fainting spells, lightheadedness breathing problems changes in hearing changes in vision chest pain high blood pressure low blood counts - This drug may decrease the number of white blood cells, red blood cells and platelets. You may be at increased risk for infections and bleeding. nausea and vomiting pain, swelling, redness or irritation at the injection site pain, tingling, numbness in the hands or feet problems with balance, talking, walking trouble passing urine or change in the amount of urine Side effects that usually do not require medical attention (report to yourdoctor or health care professional if they continue or are bothersome): hair loss loss of appetite metallic   taste in the mouth or changes in taste This list may not describe all possible side effects. Call your doctor for medical advice about side effects. You may report side effects to FDA at1-800-FDA-1088. Where should I keep my medication? This drug is given in a hospital or clinic and will not be stored at home. NOTE: This sheet is a summary. It may not cover all possible information. If you have questions about this medicine, talk to your doctor, pharmacist, orhealth care provider.  2022 Elsevier/Gold  Standard (2007-09-18 14:38:05)   

## 2021-02-03 NOTE — Progress Notes (Signed)
Patient's appetite has decreased and he does have difficulty keeping food down due to nausea/vomiting.  He has stopped taking his medications except pain meds and nausea meds due to difficulty swallowing, has a wt loss of 4 lbs.

## 2021-02-04 ENCOUNTER — Ambulatory Visit
Admission: RE | Admit: 2021-02-04 | Discharge: 2021-02-04 | Disposition: A | Discharge status: Home / Self Care | Payer: BLUE CROSS/BLUE SHIELD | Source: Ambulatory Visit | Attending: Radiation Oncology | Admitting: Radiation Oncology

## 2021-02-04 ENCOUNTER — Encounter: Payer: Self-pay | Admitting: Oncology

## 2021-02-04 ENCOUNTER — Inpatient Hospital Stay: Payer: BLUE CROSS/BLUE SHIELD

## 2021-02-04 DIAGNOSIS — C155 Malignant neoplasm of lower third of esophagus: Secondary | ICD-10-CM | POA: Diagnosis not present

## 2021-02-05 ENCOUNTER — Ambulatory Visit
Admission: RE | Admit: 2021-02-05 | Discharge: 2021-02-05 | Disposition: A | Discharge status: Home / Self Care | Payer: BLUE CROSS/BLUE SHIELD | Source: Ambulatory Visit | Attending: Radiation Oncology | Admitting: Radiation Oncology

## 2021-02-05 DIAGNOSIS — C155 Malignant neoplasm of lower third of esophagus: Secondary | ICD-10-CM | POA: Diagnosis not present

## 2021-02-06 NOTE — Progress Notes (Signed)
[facility]  [phone] [phone]) [phone]  ID: Nicholas Leblanc OB: [birth year]  MR#: [id number]  [id number]  Patient Care Team: Lesly Rubenstein, MD as PCP - General (Gastroenterology) Clent Jacks, RN as Oncology Nurse Navigator  CHIEF COMPLAINT: Stage III esophageal adenocarcinoma.  INTERVAL HISTORY: Patient returns to clinic today for further evaluation and consideration of cycle 5 of weekly carboplatinum and Taxol along with his daily XRT. He is tolerating his treatments well without significant side effects.  He continues to have dysphagia and difficulty swallowing and has noticed not much improvement.   He has no neurologic complaints.  He denies any recent fevers or illnesses.  He denies any chest pain, shortness of breath, cough, or hemoptysis.  He has had increased nausea but is only helped with ODT ondansetron.  He denies any vomiting, constipation, or diarrhea.  He has no urinary complaints.  Patient offers no further specific complaints today.  REVIEW OF SYSTEMS:   Review of Systems  Constitutional:  Positive for malaise/fatigue and weight loss. Negative for fever.  Respiratory: Negative.  Negative for cough, hemoptysis and shortness of breath.   Cardiovascular: Negative.  Negative for chest pain and leg swelling.  Gastrointestinal:  Positive for nausea. Negative for abdominal pain and heartburn.  Genitourinary: Negative.  Negative for dysuria.  Musculoskeletal:  Positive for back pain.  Skin: Negative.  Negative for rash.  Neurological:  Positive for weakness. Negative for dizziness, focal weakness and headaches.  Psychiatric/Behavioral: Negative.  The patient is not nervous/anxious.    As per HPI. Otherwise, a complete review of systems is negative.  PAST MEDICAL HISTORY: Past Medical History:  Diagnosis Date   Diabetes mellitus without complication ([place])    Hypertension     PAST SURGICAL HISTORY: No past surgical history  on file.  FAMILY HISTORY: History reviewed. No pertinent family history.  ADVANCED DIRECTIVES (Y/N):  N  HEALTH MAINTENANCE: Social History   Tobacco Use   Smoking status: Every Day    Packs/day: 1.00    Types: Cigarettes   Smokeless tobacco: Never  Vaping Use   Vaping Use: Every day  Substance Use Topics   Alcohol use: Never   Drug use: Yes    Types: Marijuana     Colonoscopy:  PAP:  Bone density:  Lipid panel:  Allergies  Allergen Reactions   Bee Venom Swelling    Doesn't require Epi-Pen    Current Outpatient Medications  Medication Sig Dispense Refill   FLUoxetine (PROZAC) 20 MG capsule Take 20 mg by mouth daily.     HYDROmorphone (DILAUDID) 8 MG tablet Take by mouth.     ibuprofen (ADVIL) 800 MG tablet Take 800 mg by mouth every 8 (eight) hours as needed.     lisinopril (ZESTRIL) 5 MG tablet Take 5 mg by mouth daily. (Patient not taking: Reported on 02/03/2021)     metFORMIN (GLUCOPHAGE) 500 MG tablet Take 500 mg by mouth 2 (two) times daily with a meal. (Patient not taking: Reported on 02/03/2021)     omeprazole (PRILOSEC) 40 MG capsule Take 1 capsule by mouth daily. (Patient not taking: Reported on 02/03/2021)     ondansetron (ZOFRAN) 8 MG tablet Take 1 tablet (8 mg total) by mouth 2 (two) times daily as needed for refractory nausea / vomiting. Start on day 3 after chemo. 60 tablet 1   ondansetron (ZOFRAN-ODT) 8 MG disintegrating tablet Take 1 tablet (8 mg total) by mouth every 8 (eight) hours as needed for nausea or  vomiting. 20 tablet 1   oxyCODONE-acetaminophen (PERCOCET) 10-325 MG tablet Take 1 tablet by mouth every 4 (four) hours as needed.     prochlorperazine (COMPAZINE) 10 MG tablet Take 1 tablet (10 mg total) by mouth every 6 (six) hours as needed for nausea or vomiting. 30 tablet 2   simvastatin (ZOCOR) 10 MG tablet Take 10 mg by mouth daily. (Patient not taking: Reported on 02/03/2021)     No current facility-administered medications for this visit.     OBJECTIVE: Vitals:   02/10/21 1003  BP: (!) 119/97  Pulse: 82  Temp: (!) 97 F (36.1 C)      Body mass index is 22.96 kg/m.    ECOG FS:1 - Symptomatic but completely ambulatory  General: Thin, no acute distress. Eyes: Pink conjunctiva, anicteric sclera. HEENT: Normocephalic, moist mucous membranes. Lungs: No audible wheezing or coughing. Heart: Regular rate and rhythm. Abdomen: Soft, nontender, no obvious distention. Musculoskeletal: No edema, cyanosis, or clubbing. Neuro: Alert, answering all questions appropriately. Cranial nerves grossly intact. Skin: No rashes or petechiae noted. Psych: Normal affect.  LAB RESULTS:  Lab Results  Component Value Date   NA 136 02/10/2021   K 3.9 02/10/2021   CL 99 02/10/2021   CO2 28 02/10/2021   GLUCOSE 150 (H) 02/10/2021   BUN 14 02/10/2021   CREATININE 0.51 (L) 02/10/2021   CALCIUM 8.6 (L) 02/10/2021   PROT 6.4 (L) 02/10/2021   ALBUMIN 4.1 02/10/2021   AST 15 02/10/2021   ALT 14 02/10/2021   ALKPHOS 57 02/10/2021   BILITOT 1.1 02/10/2021   GFRNONAA >60 02/10/2021    Lab Results  Component Value Date   WBC 4.1 02/10/2021   NEUTROABS 3.3 02/10/2021   HGB 12.1 (L) 02/10/2021   HCT 34.7 (L) 02/10/2021   MCV 88.3 02/10/2021   PLT 167 02/10/2021     STUDIES: No results found.  ASSESSMENT: Stage III esophageal adenocarcinoma.  PLAN:    1. Stage III esophageal adenocarcinoma: EGD and biopsy completed in [place] confirms adenocarcinoma.  Recent EUS and CT scan confirmed stage of disease.  PET scan results from December 31, 2020 reviewed independently with hypermetabolic distal esophageal mass, without local regional lymphadenopathy.  Patient will benefit from neoadjuvant chemotherapy with weekly carboplatinum and Taxol along with daily XRT.  This will be followed by partial esophagectomy at [facility].  Proceed with cycle 5 of weekly carboplatinum and Taxol today.  Continue daily XRT completing treatment on February 23, 2021.  Return to clinic in 1 week for further evaluation and what will be his sixth and final treatment.  At that point, patient will be referred back to [facility] for consideration of esophagectomy approximately 6 weeks after he completes his treatment 2.  Back pain: Chronic and unchanged. 3.  Weight loss: Patient continues to have a poor appetite and his weight.  He had esophageal dilation at [facility] last week. 4.  Weakness/fatigue: Chronic and unchanged. 5.  Hypokalemia: Resolved. 6.  Hyperbilirubinemia: Resolved.  Patient expressed understanding and was in agreement with this plan. He also understands that He can call clinic at any time with any questions, concerns, or complaints.   Cancer Staging Esophageal adenocarcinoma [facility]) Staging form: Esophagus - Adenocarcinoma, AJCC 8th Edition - Clinical stage from 12/04/2020: Stage III (cT3, cN1, cM0) - Signed by [name], MD on 12/04/2020 Stage prefix: Initial diagnosis  [name], MD   02/10/2021 3:51 PM

## 2021-02-08 ENCOUNTER — Inpatient Hospital Stay: Payer: BLUE CROSS/BLUE SHIELD

## 2021-02-08 ENCOUNTER — Ambulatory Visit
Admission: RE | Admit: 2021-02-08 | Discharge: 2021-02-08 | Disposition: A | Discharge status: Home / Self Care | Payer: BLUE CROSS/BLUE SHIELD | Source: Ambulatory Visit | Attending: Radiation Oncology | Admitting: Radiation Oncology

## 2021-02-08 DIAGNOSIS — C155 Malignant neoplasm of lower third of esophagus: Secondary | ICD-10-CM | POA: Diagnosis not present

## 2021-02-09 ENCOUNTER — Ambulatory Visit
Admission: RE | Admit: 2021-02-09 | Discharge: 2021-02-09 | Disposition: A | Discharge status: Home / Self Care | Payer: BLUE CROSS/BLUE SHIELD | Source: Ambulatory Visit | Attending: Radiation Oncology | Admitting: Radiation Oncology

## 2021-02-09 DIAGNOSIS — C155 Malignant neoplasm of lower third of esophagus: Secondary | ICD-10-CM | POA: Diagnosis not present

## 2021-02-10 ENCOUNTER — Ambulatory Visit: Payer: BLUE CROSS/BLUE SHIELD | Admitting: Oncology

## 2021-02-10 ENCOUNTER — Encounter: Payer: Self-pay | Admitting: Oncology

## 2021-02-10 ENCOUNTER — Ambulatory Visit: Payer: BLUE CROSS/BLUE SHIELD

## 2021-02-10 ENCOUNTER — Ambulatory Visit
Admission: RE | Admit: 2021-02-10 | Discharge: 2021-02-10 | Disposition: A | Discharge status: Home / Self Care | Payer: BLUE CROSS/BLUE SHIELD | Source: Ambulatory Visit | Attending: Radiation Oncology | Admitting: Radiation Oncology

## 2021-02-10 ENCOUNTER — Inpatient Hospital Stay (HOSPITAL_BASED_OUTPATIENT_CLINIC_OR_DEPARTMENT_OTHER): Payer: BLUE CROSS/BLUE SHIELD | Admitting: Oncology

## 2021-02-10 ENCOUNTER — Inpatient Hospital Stay: Payer: BLUE CROSS/BLUE SHIELD

## 2021-02-10 ENCOUNTER — Other Ambulatory Visit: Payer: BLUE CROSS/BLUE SHIELD

## 2021-02-10 ENCOUNTER — Other Ambulatory Visit: Payer: Self-pay

## 2021-02-10 VITALS — BP 119/97 | HR 82 | Temp 97.0°F | Wt 155.5 lb

## 2021-02-10 DIAGNOSIS — C159 Malignant neoplasm of esophagus, unspecified: Secondary | ICD-10-CM | POA: Diagnosis not present

## 2021-02-10 DIAGNOSIS — C155 Malignant neoplasm of lower third of esophagus: Secondary | ICD-10-CM | POA: Diagnosis not present

## 2021-02-10 LAB — COMPREHENSIVE METABOLIC PANEL
ALT: 14 U/L (ref 0–44)
AST: 15 U/L (ref 15–41)
Albumin: 4.1 g/dL (ref 3.5–5.0)
Alkaline Phosphatase: 57 U/L (ref 38–126)
Anion gap: 9 (ref 5–15)
BUN: 14 mg/dL (ref 6–20)
CO2: 28 mmol/L (ref 22–32)
Calcium: 8.6 mg/dL — ABNORMAL LOW (ref 8.9–10.3)
Chloride: 99 mmol/L (ref 98–111)
Creatinine, Ser: 0.51 mg/dL — ABNORMAL LOW (ref 0.61–1.24)
GFR, Estimated: >60 mL/min (ref >60)
Glucose, Bld: 150 mg/dL — ABNORMAL HIGH (ref 70–99)
Potassium: 3.9 mmol/L (ref 3.5–5.1)
Sodium: 136 mmol/L (ref 135–145)
Total Bilirubin: 1.1 mg/dL (ref 0.3–1.2)
Total Protein: 6.4 g/dL — ABNORMAL LOW (ref 6.5–8.1)

## 2021-02-10 LAB — CBC WITH DIFFERENTIAL/PLATELET
Abs Immature Granulocytes: 0.03 10*3/uL (ref 0.00–0.07)
Basophils Absolute: 0 10*3/uL (ref 0.0–0.1)
Basophils Relative: 1 %
Eosinophils Absolute: 0 10*3/uL (ref 0.0–0.5)
Eosinophils Relative: 1 %
HCT: 34.7 % — ABNORMAL LOW (ref 39.0–52.0)
Hemoglobin: 12.1 g/dL — ABNORMAL LOW (ref 13.0–17.0)
Immature Granulocytes: 1 %
Lymphocytes Relative: 8 %
Lymphs Abs: 0.3 10*3/uL — ABNORMAL LOW (ref 0.7–4.0)
MCH: 30.8 pg (ref 26.0–34.0)
MCHC: 34.9 g/dL (ref 30.0–36.0)
MCV: 88.3 fL (ref 80.0–100.0)
Monocytes Absolute: 0.4 10*3/uL (ref 0.1–1.0)
Monocytes Relative: 9 %
Neutro Abs: 3.3 10*3/uL (ref 1.7–7.7)
Neutrophils Relative %: 80 %
Platelets: 167 10*3/uL (ref 150–400)
RBC: 3.93 MIL/uL — ABNORMAL LOW (ref 4.22–5.81)
RDW: 13.4 % (ref 11.5–15.5)
WBC: 4.1 10*3/uL (ref 4.0–10.5)
nRBC: 0 % (ref 0.0–0.2)

## 2021-02-10 MED ORDER — ONDANSETRON 8 MG PO TBDP: 8.0000 mg | ORAL_TABLET | Freq: Three times a day (TID) | ORAL | 1 refills | Status: DC | PRN

## 2021-02-10 MED ORDER — SODIUM CHLORIDE 0.9 % IV SOLN: 50.0000 mg/m2 | Freq: Once | INTRAVENOUS | Status: DC

## 2021-02-10 MED ORDER — DIPHENHYDRAMINE HCL 50 MG/ML IJ SOLN
25.0000 mg | Freq: Once | INTRAMUSCULAR | Status: AC
  Administered 2021-02-10: 25 mg via INTRAVENOUS
  Filled 2021-02-10: qty 1

## 2021-02-10 MED ORDER — FAMOTIDINE 20 MG IN NS 100 ML IVPB
20.0000 mg | Freq: Once | INTRAVENOUS | Status: AC
  Administered 2021-02-10: 20 mg via INTRAVENOUS
  Filled 2021-02-10: qty 20

## 2021-02-10 MED ORDER — PALONOSETRON HCL INJECTION 0.25 MG/5ML
0.2500 mg | Freq: Once | INTRAVENOUS | Status: AC
  Administered 2021-02-10: 0.25 mg via INTRAVENOUS
  Filled 2021-02-10: qty 5

## 2021-02-10 MED ORDER — SODIUM CHLORIDE 0.9 % IV SOLN
258.0000 mg | Freq: Once | INTRAVENOUS | Status: AC
  Administered 2021-02-10: 260 mg via INTRAVENOUS
  Filled 2021-02-10: qty 26

## 2021-02-10 MED ORDER — SODIUM CHLORIDE 0.9 % IV SOLN
Freq: Once | INTRAVENOUS | Status: AC
  Filled 2021-02-10: qty 250

## 2021-02-10 MED ORDER — SODIUM CHLORIDE 0.9 % IV SOLN
50.0000 mg/m2 | Freq: Once | INTRAVENOUS | Status: AC
  Administered 2021-02-10: 90 mg via INTRAVENOUS
  Filled 2021-02-10: qty 15

## 2021-02-10 MED ORDER — SODIUM CHLORIDE 0.9 % IV SOLN: 300.0000 mg | Freq: Once | INTRAVENOUS | Status: DC

## 2021-02-10 MED ORDER — SODIUM CHLORIDE 0.9 % IV SOLN
10.0000 mg | Freq: Once | INTRAVENOUS | Status: AC
  Administered 2021-02-10: 10 mg via INTRAVENOUS
  Filled 2021-02-10: qty 10

## 2021-02-10 NOTE — Progress Notes (Signed)
Update taxol/carbo doses based on current weight per MD

## 2021-02-10 NOTE — Patient Instructions (Signed)
[facility] [place] [facility]  Discharge Instructions: Thank you for choosing [facility] to provide your oncology and hematology care.  If you have a lab appointment with the Cancer Center, please go directly to the Cancer Center and check in at the registration area.  Wear comfortable clothing and clothing appropriate for easy access to any Portacath or PICC line.   We strive to give you quality time with your provider. You may need to reschedule your appointment if you arrive late (15 or more minutes).  Arriving late affects you and other patients whose appointments are after yours.  Also, if you miss three or more appointments without notifying the office, you may be dismissed from the clinic at the provider's discretion.      For prescription refill requests, have your pharmacy contact our office and allow 72 hours for refills to be completed.    Today you received the following chemotherapy and/or immunotherapy agents: Taxol, Carboplatin      To help prevent nausea and vomiting after your treatment, we encourage you to take your nausea medication as directed.  BELOW ARE SYMPTOMS THAT SHOULD BE REPORTED IMMEDIATELY: *FEVER GREATER THAN 100.4 F (38 C) OR HIGHER *CHILLS OR SWEATING *NAUSEA AND VOMITING THAT IS NOT CONTROLLED WITH YOUR NAUSEA MEDICATION *UNUSUAL SHORTNESS OF BREATH *UNUSUAL BRUISING OR BLEEDING *URINARY PROBLEMS (pain or burning when urinating, or frequent urination) *BOWEL PROBLEMS (unusual diarrhea, constipation, pain near the anus) TENDERNESS IN MOUTH AND THROAT WITH OR WITHOUT PRESENCE OF ULCERS (sore throat, sores in mouth, or a toothache) UNUSUAL RASH, SWELLING OR PAIN  UNUSUAL VAGINAL DISCHARGE OR ITCHING   Items with * indicate a potential emergency and should be followed up as soon as possible or go to the Emergency Department if any problems should occur.  Please show the CHEMOTHERAPY ALERT CARD or IMMUNOTHERAPY ALERT CARD at  check-in to the Emergency Department and triage nurse.  Should you have questions after your visit or need to cancel or reschedule your appointment, please contact [facility] [place] [facility]  [phone] and follow the prompts.  Office hours are 8:00 a.m. to 4:30 p.m. Monday - Friday. Please note that voicemails left after 4:00 p.m. may not be returned until the following business day.  We are closed weekends and major holidays. You have access to a nurse at all times for urgent questions. Please call the main number to the clinic [phone] and follow the prompts.  For any non-urgent questions, you may also contact your provider using MyChart. We now offer e-Visits for anyone 18 and older to request care online for non-urgent symptoms. For details visit [email or web address].   Also download the MyChart app! Go to the app store, search "MyChart", open the app, select [facility], and log in with your MyChart username and password.  Due to Covid, a mask is required upon entering the hospital/clinic. If you do not have a mask, one will be given to you upon arrival. For doctor visits, patients may have 1 support person aged 18 or older with them. For treatment visits, patients cannot have anyone with them due to current Covid guidelines and our immunocompromised population. Carboplatin injection What is this medication? CARBOPLATIN (KAR boe pla tin) is a chemotherapy drug. It targets fast dividing cells, like cancer cells, and causes these cells to die. This medicine is used to treat ovarian cancer and many other cancers. This medicine may be used for other purposes; ask your health care provider or pharmacist   if you have questions. COMMON BRAND NAME(S): Paraplatin What should I tell my care team before I take this medication? They need to know if you have any of these conditions: blood disorders hearing problems kidney disease recent or ongoing radiation therapy an unusual  or allergic reaction to carboplatin, cisplatin, other chemotherapy, other medicines, foods, dyes, or preservatives pregnant or trying to get pregnant breast-feeding How should I use this medication? This drug is usually given as an infusion into a vein. It is administered in a hospital or clinic by a specially trained health care professional. Talk to your pediatrician regarding the use of this medicine in children. Special care may be needed. Overdosage: If you think you have taken too much of this medicine contact a poison control center or emergency room at once. NOTE: This medicine is only for you. Do not share this medicine with others. What if I miss a dose? It is important not to miss a dose. Call your doctor or health care professional if you are unable to keep an appointment. What may interact with this medication? medicines for seizures medicines to increase blood counts like filgrastim, pegfilgrastim, sargramostim some antibiotics like amikacin, gentamicin, neomycin, streptomycin, tobramycin vaccines Talk to your doctor or health care professional before taking any of these medicines: acetaminophen aspirin ibuprofen ketoprofen naproxen This list may not describe all possible interactions. Give your health care provider a list of all the medicines, herbs, non-prescription drugs, or dietary supplements you use. Also tell them if you smoke, drink alcohol, or use illegal drugs. Some items may interact with your medicine. What should I watch for while using this medication? Your condition will be monitored carefully while you are receiving this medicine. You will need important blood work done while you are taking this medicine. This drug may make you feel generally unwell. This is not uncommon, as chemotherapy can affect healthy cells as well as cancer cells. Report any side effects. Continue your course of treatment even though you feel ill unless your doctor tells you to stop. In  some cases, you may be given additional medicines to help with side effects. Follow all directions for their use. Call your doctor or health care professional for advice if you get a fever, chills or sore throat, or other symptoms of a cold or flu. Do not treat yourself. This drug decreases your body's ability to fight infections. Try to avoid being around people who are sick. This medicine may increase your risk to bruise or bleed. Call your doctor or health care professional if you notice any unusual bleeding. Be careful brushing and flossing your teeth or using a toothpick because you may get an infection or bleed more easily. If you have any dental work done, tell your dentist you are receiving this medicine. Avoid taking products that contain aspirin, acetaminophen, ibuprofen, naproxen, or ketoprofen unless instructed by your doctor. These medicines may hide a fever. Do not become pregnant while taking this medicine. Women should inform their doctor if they wish to become pregnant or think they might be pregnant. There is a potential for serious side effects to an unborn child. Talk to your health care professional or pharmacist for more information. Do not breast-feed an infant while taking this medicine. What side effects may I notice from receiving this medication? Side effects that you should report to your doctor or health care professional as soon as possible: allergic reactions like skin rash, itching or hives, swelling of the face, lips, or tongue signs   of infection - fever or chills, cough, sore throat, pain or difficulty passing urine signs of decreased platelets or bleeding - bruising, pinpoint red spots on the skin, black, tarry stools, nosebleeds signs of decreased red blood cells - unusually weak or tired, fainting spells, lightheadedness breathing problems changes in hearing changes in vision chest pain high blood pressure low blood counts - This drug may decrease the number of  white blood cells, red blood cells and platelets. You may be at increased risk for infections and bleeding. nausea and vomiting pain, swelling, redness or irritation at the injection site pain, tingling, numbness in the hands or feet problems with balance, talking, walking trouble passing urine or change in the amount of urine Side effects that usually do not require medical attention (report to your doctor or health care professional if they continue or are bothersome): hair loss loss of appetite metallic taste in the mouth or changes in taste This list may not describe all possible side effects. Call your doctor for medical advice about side effects. You may report side effects to FDA at 1-800-FDA-1088. Where should I keep my medication? This drug is given in a hospital or clinic and will not be stored at home. NOTE: This sheet is a summary. It may not cover all possible information. If you have questions about this medicine, talk to your doctor, pharmacist, or health care provider.  2022 Elsevier/Gold Standard (2007-09-18 14:38:05) Paclitaxel injection What is this medication? PACLITAXEL (PAK li TAX el) is a chemotherapy drug. It targets fast dividing cells, like cancer cells, and causes these cells to die. This medicine is used to treat ovarian cancer, breast cancer, lung cancer, Kaposi's sarcoma, and other cancers. This medicine may be used for other purposes; ask your health care provider or pharmacist if you have questions. COMMON BRAND NAME(S): Onxol, Taxol What should I tell my care team before I take this medication? They need to know if you have any of these conditions: history of irregular heartbeat liver disease low blood counts, like low white cell, platelet, or red cell counts lung or breathing disease, like asthma tingling of the fingers or toes, or other nerve disorder an unusual or allergic reaction to paclitaxel, alcohol, polyoxyethylated castor oil, other chemotherapy,  other medicines, foods, dyes, or preservatives pregnant or trying to get pregnant breast-feeding How should I use this medication? This drug is given as an infusion into a vein. It is administered in a hospital or clinic by a specially trained health care professional. Talk to your pediatrician regarding the use of this medicine in children. Special care may be needed. Overdosage: If you think you have taken too much of this medicine contact a poison control center or emergency room at once. NOTE: This medicine is only for you. Do not share this medicine with others. What if I miss a dose? It is important not to miss your dose. Call your doctor or health care professional if you are unable to keep an appointment. What may interact with this medication? Do not take this medicine with any of the following medications: live virus vaccines This medicine may also interact with the following medications: antiviral medicines for hepatitis, HIV or AIDS certain antibiotics like erythromycin and clarithromycin certain medicines for fungal infections like ketoconazole and itraconazole certain medicines for seizures like carbamazepine, phenobarbital, phenytoin gemfibrozil nefazodone rifampin St. John's wort This list may not describe all possible interactions. Give your health care provider a list of all the medicines, herbs, non-prescription drugs, or   dietary supplements you use. Also tell them if you smoke, drink alcohol, or use illegal drugs. Some items may interact with your medicine. What should I watch for while using this medication? Your condition will be monitored carefully while you are receiving this medicine. You will need important blood work done while you are taking this medicine. This medicine can cause serious allergic reactions. To reduce your risk you will need to take other medicine(s) before treatment with this medicine. If you experience allergic reactions like skin rash, itching  or hives, swelling of the face, lips, or tongue, tell your doctor or health care professional right away. In some cases, you may be given additional medicines to help with side effects. Follow all directions for their use. This drug may make you feel generally unwell. This is not uncommon, as chemotherapy can affect healthy cells as well as cancer cells. Report any side effects. Continue your course of treatment even though you feel ill unless your doctor tells you to stop. Call your doctor or health care professional for advice if you get a fever, chills or sore throat, or other symptoms of a cold or flu. Do not treat yourself. This drug decreases your body's ability to fight infections. Try to avoid being around people who are sick. This medicine may increase your risk to bruise or bleed. Call your doctor or health care professional if you notice any unusual bleeding. Be careful brushing and flossing your teeth or using a toothpick because you may get an infection or bleed more easily. If you have any dental work done, tell your dentist you are receiving this medicine. Avoid taking products that contain aspirin, acetaminophen, ibuprofen, naproxen, or ketoprofen unless instructed by your doctor. These medicines may hide a fever. Do not become pregnant while taking this medicine. Women should inform their doctor if they wish to become pregnant or think they might be pregnant. There is a potential for serious side effects to an unborn child. Talk to your health care professional or pharmacist for more information. Do not breast-feed an infant while taking this medicine. Men are advised not to father a child while receiving this medicine. This product may contain alcohol. Ask your pharmacist or healthcare provider if this medicine contains alcohol. Be sure to tell all healthcare providers you are taking this medicine. Certain medicines, like metronidazole and disulfiram, can cause an unpleasant reaction when  taken with alcohol. The reaction includes flushing, headache, nausea, vomiting, sweating, and increased thirst. The reaction can last from 30 minutes to several hours. What side effects may I notice from receiving this medication? Side effects that you should report to your doctor or health care professional as soon as possible: allergic reactions like skin rash, itching or hives, swelling of the face, lips, or tongue breathing problems changes in vision fast, irregular heartbeat high or low blood pressure mouth sores pain, tingling, numbness in the hands or feet signs of decreased platelets or bleeding - bruising, pinpoint red spots on the skin, black, tarry stools, blood in the urine signs of decreased red blood cells - unusually weak or tired, feeling faint or lightheaded, falls signs of infection - fever or chills, cough, sore throat, pain or difficulty passing urine signs and symptoms of liver injury like dark yellow or brown urine; general ill feeling or flu-like symptoms; light-colored stools; loss of appetite; nausea; right upper belly pain; unusually weak or tired; yellowing of the eyes or skin swelling of the ankles, feet, hands unusually slow heartbeat Side   effects that usually do not require medical attention (report to your doctor or health care professional if they continue or are bothersome): diarrhea hair loss loss of appetite muscle or joint pain nausea, vomiting pain, redness, or irritation at site where injected tiredness This list may not describe all possible side effects. Call your doctor for medical advice about side effects. You may report side effects to FDA at 1-800-FDA-1088. Where should I keep my medication? This drug is given in a hospital or clinic and will not be stored at home. NOTE: This sheet is a summary. It may not cover all possible information. If you have questions about this medicine, talk to your doctor, pharmacist, or health care provider.  2022  Elsevier/Gold Standard (2019-05-15 13:37:23)  

## 2021-02-10 NOTE — Progress Notes (Signed)
Nutrition Follow-up:   Patient with esophageal adenocarcinoma, followed by Dr Grayland Ormond.  Patient receiving concurrent chemotherapy and radiation.  S/p EGD with esophageal dilation at [facility] with patient during infusion.  Patient reports that swallowing is better after dilation.  Yesterday able to eat beef, cheese and mayo wrap and get it down.  Also had sherbet yesterday.  Having issues with nausea.  Says MD is prescribing dissolvable tablet for him to try.  Reports right after procedure was able to eat steak, asparagus and pie.  Lately nausea has been keeping him from eating.      Medications: zofran ODT  Labs: reviewed  Anthropometrics:   Weight 155 lb 8 oz on medical oncology scale today (?? Accuracy) 165 lb 8 oz on Aria scale yesterday??  165 lb on 8/10 171 lb 3.2 oz on 7/27 215 lb 11.2 zo on 5/26   NUTRITION DIAGNOSIS: Inadequate oral intake improved   INTERVENTION:  Encouraged patient to be proactive with nausea medications Encouraged him to call clinic for fluids between treatment if not eating and having issues with nausea Continue high calorie, soft moist foods s/p EGD. Discussed options today      MONITORING, EVALUATION, GOAL: weight trends, intake   NEXT VISIT: Wednesday, August 24 during infusion  Sriram Febles B. Zenia Resides, [place], [place] Registered Dietitian [phone] (mobile)

## 2021-02-11 ENCOUNTER — Ambulatory Visit
Admission: RE | Admit: 2021-02-11 | Discharge: 2021-02-11 | Disposition: A | Discharge status: Home / Self Care | Payer: BLUE CROSS/BLUE SHIELD | Source: Ambulatory Visit | Attending: Radiation Oncology | Admitting: Radiation Oncology

## 2021-02-11 DIAGNOSIS — C155 Malignant neoplasm of lower third of esophagus: Secondary | ICD-10-CM | POA: Diagnosis not present

## 2021-02-12 ENCOUNTER — Ambulatory Visit
Admission: RE | Admit: 2021-02-12 | Discharge: 2021-02-12 | Disposition: A | Discharge status: Home / Self Care | Payer: BLUE CROSS/BLUE SHIELD | Source: Ambulatory Visit | Attending: Radiation Oncology | Admitting: Radiation Oncology

## 2021-02-12 DIAGNOSIS — C155 Malignant neoplasm of lower third of esophagus: Secondary | ICD-10-CM | POA: Diagnosis not present

## 2021-02-15 ENCOUNTER — Inpatient Hospital Stay: Payer: BLUE CROSS/BLUE SHIELD

## 2021-02-15 ENCOUNTER — Ambulatory Visit
Admission: RE | Admit: 2021-02-15 | Discharge: 2021-02-15 | Disposition: A | Discharge status: Home / Self Care | Payer: BLUE CROSS/BLUE SHIELD | Source: Ambulatory Visit | Attending: Radiation Oncology | Admitting: Radiation Oncology

## 2021-02-15 ENCOUNTER — Other Ambulatory Visit: Payer: Self-pay | Admitting: Hospice and Palliative Medicine

## 2021-02-15 VITALS — BP 123/79 | HR 92 | Temp 95.5°F | Resp 16 | Wt 164.0 lb

## 2021-02-15 DIAGNOSIS — C159 Malignant neoplasm of esophagus, unspecified: Secondary | ICD-10-CM | POA: Diagnosis not present

## 2021-02-15 DIAGNOSIS — C155 Malignant neoplasm of lower third of esophagus: Secondary | ICD-10-CM | POA: Diagnosis not present

## 2021-02-15 MED ORDER — OLANZAPINE 10 MG PO TABS: 5.0000 mg | ORAL_TABLET | Freq: Every evening | ORAL | 2 refills | Status: DC | PRN

## 2021-02-15 MED ORDER — HEPARIN SOD (PORK) LOCK FLUSH 100 UNIT/ML IV SOLN
500.0000 [IU] | Freq: Once | INTRAVENOUS | Status: DC | PRN
  Filled 2021-02-15: qty 5

## 2021-02-15 MED ORDER — SODIUM CHLORIDE 0.9 % IV SOLN
Freq: Once | INTRAVENOUS | Status: AC
  Filled 2021-02-15: qty 250

## 2021-02-15 MED ORDER — SODIUM CHLORIDE 0.9% FLUSH
10.0000 mL | Freq: Once | INTRAVENOUS | Status: DC | PRN
  Filled 2021-02-15: qty 10

## 2021-02-15 NOTE — Progress Notes (Signed)
Pt states that he's been nauseated all weekend, and has not been able to eat much. Per Dr. Grayland Ormond, give 1L NS. Pt added to fluid clinic schedule. New Rx for Olanzapine qhs sent in by Billey Chang, NP. Pt informed of new Rx and instructed to take at bedtime, continue Zofran prn, and not to take compazine. Pt verbalized understanding.

## 2021-02-15 NOTE — Progress Notes (Signed)
Patient reported poorly controlled nausea without vomiting unrelieved after taking scheduled ondansetron and prochlorperazine.  Patient to be seen today in fluid clinic.  Will rotate from prochlorperazine to olanzapine.

## 2021-02-16 ENCOUNTER — Ambulatory Visit
Admission: RE | Admit: 2021-02-16 | Discharge: 2021-02-16 | Disposition: A | Discharge status: Home / Self Care | Payer: BLUE CROSS/BLUE SHIELD | Source: Ambulatory Visit | Attending: Radiation Oncology | Admitting: Radiation Oncology

## 2021-02-16 DIAGNOSIS — C155 Malignant neoplasm of lower third of esophagus: Secondary | ICD-10-CM | POA: Diagnosis not present

## 2021-02-17 ENCOUNTER — Ambulatory Visit
Admission: RE | Admit: 2021-02-17 | Discharge: 2021-02-17 | Disposition: A | Discharge status: Home / Self Care | Payer: BLUE CROSS/BLUE SHIELD | Source: Ambulatory Visit | Attending: Radiation Oncology | Admitting: Radiation Oncology

## 2021-02-17 ENCOUNTER — Inpatient Hospital Stay (HOSPITAL_BASED_OUTPATIENT_CLINIC_OR_DEPARTMENT_OTHER): Payer: BLUE CROSS/BLUE SHIELD | Admitting: Oncology

## 2021-02-17 ENCOUNTER — Inpatient Hospital Stay: Payer: BLUE CROSS/BLUE SHIELD

## 2021-02-17 ENCOUNTER — Encounter: Payer: Self-pay | Admitting: Oncology

## 2021-02-17 VITALS — BP 105/72 | HR 97 | Temp 96.6°F | Resp 18 | Wt 163.8 lb

## 2021-02-17 DIAGNOSIS — C155 Malignant neoplasm of lower third of esophagus: Secondary | ICD-10-CM | POA: Diagnosis not present

## 2021-02-17 DIAGNOSIS — C159 Malignant neoplasm of esophagus, unspecified: Secondary | ICD-10-CM | POA: Diagnosis not present

## 2021-02-17 LAB — CBC WITH DIFFERENTIAL/PLATELET
Abs Immature Granulocytes: 0.03 10*3/uL (ref 0.00–0.07)
Basophils Absolute: 0 10*3/uL (ref 0.0–0.1)
Basophils Relative: 1 %
Eosinophils Absolute: 0.1 10*3/uL (ref 0.0–0.5)
Eosinophils Relative: 2 %
HCT: 32.7 % — ABNORMAL LOW (ref 39.0–52.0)
Hemoglobin: 11.4 g/dL — ABNORMAL LOW (ref 13.0–17.0)
Immature Granulocytes: 1 %
Lymphocytes Relative: 13 %
Lymphs Abs: 0.5 10*3/uL — ABNORMAL LOW (ref 0.7–4.0)
MCH: 31.5 pg (ref 26.0–34.0)
MCHC: 34.9 g/dL (ref 30.0–36.0)
MCV: 90.3 fL (ref 80.0–100.0)
Monocytes Absolute: 0.3 10*3/uL (ref 0.1–1.0)
Monocytes Relative: 9 %
Neutro Abs: 2.8 10*3/uL (ref 1.7–7.7)
Neutrophils Relative %: 74 %
Platelets: 162 10*3/uL (ref 150–400)
RBC: 3.62 MIL/uL — ABNORMAL LOW (ref 4.22–5.81)
RDW: 14.7 % (ref 11.5–15.5)
WBC: 3.7 10*3/uL — ABNORMAL LOW (ref 4.0–10.5)
nRBC: 0 % (ref 0.0–0.2)

## 2021-02-17 LAB — COMPREHENSIVE METABOLIC PANEL
ALT: 11 U/L (ref 0–44)
AST: 17 U/L (ref 15–41)
Albumin: 3.7 g/dL (ref 3.5–5.0)
Alkaline Phosphatase: 56 U/L (ref 38–126)
Anion gap: 10 (ref 5–15)
BUN: 13 mg/dL (ref 6–20)
CO2: 27 mmol/L (ref 22–32)
Calcium: 9.2 mg/dL (ref 8.9–10.3)
Chloride: 103 mmol/L (ref 98–111)
Creatinine, Ser: 0.68 mg/dL (ref 0.61–1.24)
GFR, Estimated: >60 mL/min (ref >60)
Glucose, Bld: 132 mg/dL — ABNORMAL HIGH (ref 70–99)
Potassium: 4.5 mmol/L (ref 3.5–5.1)
Sodium: 140 mmol/L (ref 135–145)
Total Bilirubin: 1.2 mg/dL (ref 0.3–1.2)
Total Protein: 6.3 g/dL — ABNORMAL LOW (ref 6.5–8.1)

## 2021-02-17 MED ORDER — DIPHENHYDRAMINE HCL 50 MG/ML IJ SOLN
25.0000 mg | Freq: Once | INTRAMUSCULAR | Status: AC
  Administered 2021-02-17: 25 mg via INTRAVENOUS
  Filled 2021-02-17: qty 1

## 2021-02-17 MED ORDER — FAMOTIDINE 20 MG IN NS 100 ML IVPB
20.0000 mg | Freq: Once | INTRAVENOUS | Status: AC
  Administered 2021-02-17: 20 mg via INTRAVENOUS
  Filled 2021-02-17: qty 20

## 2021-02-17 MED ORDER — SODIUM CHLORIDE 0.9 % IV SOLN
10.0000 mg | Freq: Once | INTRAVENOUS | Status: AC
  Administered 2021-02-17: 10 mg via INTRAVENOUS
  Filled 2021-02-17: qty 10

## 2021-02-17 MED ORDER — PALONOSETRON HCL INJECTION 0.25 MG/5ML
0.2500 mg | Freq: Once | INTRAVENOUS | Status: AC
  Administered 2021-02-17: 0.25 mg via INTRAVENOUS
  Filled 2021-02-17: qty 5

## 2021-02-17 MED ORDER — SODIUM CHLORIDE 0.9 % IV SOLN
Freq: Once | INTRAVENOUS | Status: AC
  Filled 2021-02-17: qty 250

## 2021-02-17 MED ORDER — SODIUM CHLORIDE 0.9 % IV SOLN
260.0000 mg | Freq: Once | INTRAVENOUS | Status: AC
  Administered 2021-02-17: 260 mg via INTRAVENOUS
  Filled 2021-02-17: qty 26

## 2021-02-17 MED ORDER — PACLITAXEL CHEMO INJECTION 300 MG/50ML
50.0000 mg/m2 | Freq: Once | INTRAVENOUS | Status: AC
  Administered 2021-02-17: 96 mg via INTRAVENOUS
  Filled 2021-02-17: qty 16

## 2021-02-17 MED ORDER — SODIUM CHLORIDE 0.9 % IV SOLN: 300.0000 mg | Freq: Once | INTRAVENOUS | Status: DC

## 2021-02-17 NOTE — Progress Notes (Signed)
Patient denies new problems/concerns today.   °

## 2021-02-17 NOTE — Patient Instructions (Signed)
[facility] [place]  Discharge Instructions: Thank you for choosing [facility] to provide your oncology and hematology care.  If you have a lab appointment with the Cancer Center, please go directly to the Cancer Center and check in at the registration area.  Wear comfortable clothing and clothing appropriate for easy access to any Portacath or PICC line.   We strive to give you quality time with your provider. You may need to reschedule your appointment if you arrive late (15 or more minutes).  Arriving late affects you and other patients whose appointments are after yours.  Also, if you miss three or more appointments without notifying the office, you may be dismissed from the clinic at the provider's discretion.      For prescription refill requests, have your pharmacy contact our office and allow 72 hours for refills to be completed.    Today you received the following chemotherapy and/or immunotherapy agents       To help prevent nausea and vomiting after your treatment, we encourage you to take your nausea medication as directed.  BELOW ARE SYMPTOMS THAT SHOULD BE REPORTED IMMEDIATELY: *FEVER GREATER THAN 100.4 F (38 C) OR HIGHER *CHILLS OR SWEATING *NAUSEA AND VOMITING THAT IS NOT CONTROLLED WITH YOUR NAUSEA MEDICATION *UNUSUAL SHORTNESS OF BREATH *UNUSUAL BRUISING OR BLEEDING *URINARY PROBLEMS (pain or burning when urinating, or frequent urination) *BOWEL PROBLEMS (unusual diarrhea, constipation, pain near the anus) TENDERNESS IN MOUTH AND THROAT WITH OR WITHOUT PRESENCE OF ULCERS (sore throat, sores in mouth, or a toothache) UNUSUAL RASH, SWELLING OR PAIN  UNUSUAL VAGINAL DISCHARGE OR ITCHING   Items with * indicate a potential emergency and should be followed up as soon as possible or go to the Emergency Department if any problems should occur.  Please show the CHEMOTHERAPY ALERT CARD or IMMUNOTHERAPY ALERT CARD at check-in to the  Emergency Department and triage nurse.  Should you have questions after your visit or need to cancel or reschedule your appointment, please contact [facility]  [phone] and follow the prompts.  Office hours are 8:00 a.m. to 4:30 p.m. Monday - Friday. Please note that voicemails left after 4:00 p.m. may not be returned until the following business day.  We are closed weekends and major holidays. You have access to a nurse at all times for urgent questions. Please call the main number to the clinic [phone] and follow the prompts.  For any non-urgent questions, you may also contact your provider using MyChart. We now offer e-Visits for anyone 18 and older to request care online for non-urgent symptoms. For details visit [email or web address].   Also download the MyChart app! Go to the app store, search "MyChart", open the app, select [facility], and log in with your MyChart username and password.  Due to Covid, a mask is required upon entering the hospital/clinic. If you do not have a mask, one will be given to you upon arrival. For doctor visits, patients may have 1 support person aged 18 or older with them. For treatment visits, patients cannot have anyone with them due to current Covid guidelines and our immunocompromised population.  

## 2021-02-17 NOTE — Progress Notes (Signed)
Nutrition  Called patient for virtual visit (number listed is daughter).  No answer.  Left message with call back number.  Also called home number listed in chart and no answer.  Will follow-up as able  Murle Otting B. Zenia Resides, [place], [place] Registered Dietitian [phone] (mobile)

## 2021-02-17 NOTE — Progress Notes (Signed)
[place]  Telephone:(336) [phone] Fax:(336) [phone]  ID: Nicholas Leblanc OB: Apr 20, 1966  MR#: [id number]  [id number]  Patient Care Team: Lesly Rubenstein, MD as PCP - General (Gastroenterology) Clent Jacks, RN as Oncology Nurse Navigator  CHIEF COMPLAINT: Stage III esophageal adenocarcinoma.  INTERVAL HISTORY: Patient returns to clinic today for further evaluation and consideration of cycle 6 of weekly carboplatin and Taxol along with his daily XRT.  He continues to tolerate his treatments well without significant side effects.  His swallowing has improved since his esophageal dilation last week at [facility].  He continues to have chronic weakness and fatigue.  He has no neurologic complaints.  He denies any recent fevers or illnesses.  He denies any chest pain, shortness of breath, cough, or hemoptysis.  He does not complain of nausea today.  He denies any vomiting, constipation, or diarrhea.  He has no urinary complaints.  Patient offers no further specific complaints today.  REVIEW OF SYSTEMS:   Review of Systems  Constitutional:  Positive for weight loss. Negative for fever and malaise/fatigue.  Respiratory: Negative.  Negative for cough, hemoptysis and shortness of breath.   Cardiovascular: Negative.  Negative for chest pain and leg swelling.  Gastrointestinal: Negative.  Negative for abdominal pain, heartburn and nausea.  Genitourinary: Negative.  Negative for dysuria.  Musculoskeletal:  Positive for back pain.  Skin: Negative.  Negative for rash.  Neurological:  Positive for weakness. Negative for dizziness, focal weakness and headaches.  Psychiatric/Behavioral: Negative.  The patient is not nervous/anxious.    As per HPI. Otherwise, a complete review of systems is negative.  PAST MEDICAL HISTORY: Past Medical History:  Diagnosis Date   Diabetes mellitus without complication ([place])    Hypertension     PAST SURGICAL HISTORY: No past  surgical history on file.  FAMILY HISTORY: History reviewed. No pertinent family history.  ADVANCED DIRECTIVES (Y/N):  N  HEALTH MAINTENANCE: Social History   Tobacco Use   Smoking status: Every Day    Packs/day: 1.00    Types: Cigarettes   Smokeless tobacco: Never  Vaping Use   Vaping Use: Every day  Substance Use Topics   Alcohol use: Never   Drug use: Yes    Types: Marijuana     Colonoscopy:  PAP:  Bone density:  Lipid panel:  Allergies  Allergen Reactions   Bee Venom Swelling    Doesn't require Epi-Pen    Current Outpatient Medications  Medication Sig Dispense Refill   HYDROmorphone (DILAUDID) 8 MG tablet Take by mouth.     OLANZapine (ZYPREXA) 10 MG tablet Take 0.5-1 tablets (5-10 mg total) by mouth at bedtime as needed (nausea). 30 tablet 2   ondansetron (ZOFRAN) 8 MG tablet Take 1 tablet (8 mg total) by mouth 2 (two) times daily as needed for refractory nausea / vomiting. Start on day 3 after chemo. 60 tablet 1   ondansetron (ZOFRAN-ODT) 8 MG disintegrating tablet Take 1 tablet (8 mg total) by mouth every 8 (eight) hours as needed for nausea or vomiting. 20 tablet 1   oxyCODONE-acetaminophen (PERCOCET) 10-325 MG tablet Take 1 tablet by mouth every 4 (four) hours as needed.     FLUoxetine (PROZAC) 20 MG capsule Take 20 mg by mouth daily. (Patient not taking: Reported on 02/17/2021)     ibuprofen (ADVIL) 800 MG tablet Take 800 mg by mouth every 8 (eight) hours as needed. (Patient not taking: Reported on 02/17/2021)     lisinopril (ZESTRIL) 5 MG tablet  Take 5 mg by mouth daily. (Patient not taking: No sig reported)     metFORMIN (GLUCOPHAGE) 500 MG tablet Take 500 mg by mouth 2 (two) times daily with a meal. (Patient not taking: No sig reported)     omeprazole (PRILOSEC) 40 MG capsule Take 1 capsule by mouth daily. (Patient not taking: No sig reported)     simvastatin (ZOCOR) 10 MG tablet Take 10 mg by mouth daily. (Patient not taking: No sig reported)     No current  facility-administered medications for this visit.    OBJECTIVE: Vitals:   02/17/21 1000  BP: 105/72  Pulse: 97  Resp: 18  Temp: (!) 96.6 F (35.9 C)      Body mass index is 24.19 kg/m.    ECOG FS:1 - Symptomatic but completely ambulatory  General: Thin, no acute distress. Eyes: Pink conjunctiva, anicteric sclera. HEENT: Normocephalic, moist mucous membranes. Lungs: No audible wheezing or coughing. Heart: Regular rate and rhythm. Abdomen: Soft, nontender, no obvious distention. Musculoskeletal: No edema, cyanosis, or clubbing. Neuro: Alert, answering all questions appropriately. Cranial nerves grossly intact. Skin: No rashes or petechiae noted. Psych: Normal affect.   LAB RESULTS:  Lab Results  Component Value Date   NA 140 02/17/2021   K 4.5 02/17/2021   CL 103 02/17/2021   CO2 27 02/17/2021   GLUCOSE 132 (H) 02/17/2021   BUN 13 02/17/2021   CREATININE 0.68 02/17/2021   CALCIUM 9.2 02/17/2021   PROT 6.3 (L) 02/17/2021   ALBUMIN 3.7 02/17/2021   AST 17 02/17/2021   ALT 11 02/17/2021   ALKPHOS 56 02/17/2021   BILITOT 1.2 02/17/2021   GFRNONAA >60 02/17/2021    Lab Results  Component Value Date   WBC 3.7 (L) 02/17/2021   NEUTROABS 2.8 02/17/2021   HGB 11.4 (L) 02/17/2021   HCT 32.7 (L) 02/17/2021   MCV 90.3 02/17/2021   PLT 162 02/17/2021     STUDIES: No results found.  ASSESSMENT: Stage III esophageal adenocarcinoma.  PLAN:    1. Stage III esophageal adenocarcinoma: EGD and biopsy completed in [place] confirmed adenocarcinoma. EUS and CT scan confirmed stage of disease.  PET scan results from December 31, 2020 reviewed independently with hypermetabolic distal esophageal mass, without local regional lymphadenopathy.  Patient will benefit from neoadjuvant chemotherapy with weekly carboplatinum and Taxol along with daily XRT.  This will be followed by partial esophagectomy at [facility].  Proceed with cycle 6 of weekly carboplatinum and Taxol today.   Continue daily XRT completing treatment on February 23, 2021.  Patient will now be referred back to [facility] for consideration of esophagectomy approximately 6 weeks after he completes his treatment. Follow up back at [facility] about 1 month pot-op. 2.  Back pain: Chronic and unchanged. 3.  Weight loss: Patient continues to have a poor appetite, but improved since his esophageal dilation at [facility] last week. 4.  Weakness/fatigue: Chronic and unchanged. 5.  Hypokalemia: Resolved. 6.  Hyperbilirubinemia: Resolved. 7.  Anemia: Mild, monitor.  Patient expressed understanding and was in agreement with this plan. He also understands that He can call clinic at any time with any questions, concerns, or complaints.   Cancer Staging Esophageal adenocarcinoma [facility]) Staging form: Esophagus - Adenocarcinoma, AJCC 8th Edition - Clinical stage from 12/04/2020: Stage III (cT3, cN1, cM0) - Signed by [name], MD on 12/04/2020 Stage prefix: Initial diagnosis  [name], MD   02/17/2021 1:48 PM

## 2021-02-18 ENCOUNTER — Ambulatory Visit
Admission: RE | Admit: 2021-02-18 | Discharge: 2021-02-18 | Disposition: A | Discharge status: Home / Self Care | Payer: BLUE CROSS/BLUE SHIELD | Source: Ambulatory Visit | Attending: Radiation Oncology | Admitting: Radiation Oncology

## 2021-02-18 DIAGNOSIS — C155 Malignant neoplasm of lower third of esophagus: Secondary | ICD-10-CM | POA: Diagnosis not present

## 2021-02-19 ENCOUNTER — Ambulatory Visit
Admission: RE | Admit: 2021-02-19 | Discharge: 2021-02-19 | Disposition: A | Discharge status: Home / Self Care | Payer: BLUE CROSS/BLUE SHIELD | Source: Ambulatory Visit | Attending: Radiation Oncology | Admitting: Radiation Oncology

## 2021-02-19 DIAGNOSIS — C155 Malignant neoplasm of lower third of esophagus: Secondary | ICD-10-CM | POA: Diagnosis not present

## 2021-02-22 ENCOUNTER — Ambulatory Visit
Admission: RE | Admit: 2021-02-22 | Discharge: 2021-02-22 | Disposition: A | Discharge status: Home / Self Care | Payer: BLUE CROSS/BLUE SHIELD | Source: Ambulatory Visit | Attending: Radiation Oncology | Admitting: Radiation Oncology

## 2021-02-22 DIAGNOSIS — C155 Malignant neoplasm of lower third of esophagus: Secondary | ICD-10-CM | POA: Diagnosis not present

## 2021-02-23 ENCOUNTER — Ambulatory Visit: Payer: BLUE CROSS/BLUE SHIELD

## 2021-02-23 ENCOUNTER — Ambulatory Visit
Admission: RE | Admit: 2021-02-23 | Discharge: 2021-02-23 | Disposition: A | Discharge status: Home / Self Care | Payer: BLUE CROSS/BLUE SHIELD | Source: Ambulatory Visit | Attending: Radiation Oncology | Admitting: Radiation Oncology

## 2021-02-23 DIAGNOSIS — C155 Malignant neoplasm of lower third of esophagus: Secondary | ICD-10-CM | POA: Diagnosis not present

## 2021-02-24 ENCOUNTER — Ambulatory Visit: Payer: BLUE CROSS/BLUE SHIELD

## 2021-03-10 ENCOUNTER — Other Ambulatory Visit: Payer: Self-pay | Admitting: *Deleted

## 2021-03-10 MED ORDER — OLANZAPINE 10 MG PO TABS: 5.0000 mg | ORAL_TABLET | Freq: Every evening | ORAL | 2 refills | Status: DC | PRN

## 2021-03-29 ENCOUNTER — Ambulatory Visit: Payer: BLUE CROSS/BLUE SHIELD | Admitting: Radiation Oncology

## 2021-04-28 ENCOUNTER — Telehealth: Payer: Self-pay | Admitting: Emergency Medicine

## 2021-04-28 NOTE — Telephone Encounter (Signed)
Opened in error

## 2021-05-24 NOTE — Progress Notes (Signed)
[place]  Telephone:(336) [phone] [phone]) [phone]  ID: Nicholas Leblanc OB: 1966/05/25  MR#: [id number]  XAJ#:[id number]  Patient Care Team: Lesly Rubenstein, MD as PCP - General (Gastroenterology) Clent Jacks, RN as Oncology Nurse Navigator  CHIEF COMPLAINT: Pathologic stage IIIa esophageal adenocarcinoma.  INTERVAL HISTORY: Patient returns to clinic today approximately 3 weeks postoperative for further evaluation.  He had a near complete pathologic response with no evidence of the primary tumor and only 1 lymph node positive for disease.  He had been doing tube feeds postoperatively up until yesterday when he switched to 100% by mouth.  He continues to have significant back pain.  He otherwise feels well.  He has no neurologic complaints.  He denies any recent fevers or illnesses.  He denies any chest pain, shortness of breath, cough, or hemoptysis.  He denies any nausea, vomiting, constipation, or diarrhea.  He has no urinary complaints.  Patient offers no further specific complaints today.  REVIEW OF SYSTEMS:   Review of Systems  Constitutional: Negative.  Negative for fever, malaise/fatigue and weight loss.  Respiratory: Negative.  Negative for cough, hemoptysis and shortness of breath.   Cardiovascular: Negative.  Negative for chest pain and leg swelling.  Gastrointestinal: Negative.  Negative for abdominal pain, heartburn and nausea.  Genitourinary: Negative.  Negative for dysuria.  Musculoskeletal:  Positive for back pain.  Skin: Negative.  Negative for rash.  Neurological: Negative.  Negative for dizziness, focal weakness, weakness and headaches.  Psychiatric/Behavioral: Negative.  The patient is not nervous/anxious.    As per HPI. Otherwise, a complete review of systems is negative.  PAST MEDICAL HISTORY: Past Medical History:  Diagnosis Date   Diabetes mellitus without complication ([place])    Hypertension     PAST SURGICAL HISTORY: No past  surgical history on file.  FAMILY HISTORY: No family history on file.  ADVANCED DIRECTIVES (Y/N):  N  HEALTH MAINTENANCE: Social History   Tobacco Use   Smoking status: Every Day    Packs/day: 1.00    Types: Cigarettes   Smokeless tobacco: Never  Vaping Use   Vaping Use: Every day  Substance Use Topics   Alcohol use: Never   Drug use: Yes    Types: Marijuana     Colonoscopy:  PAP:  Bone density:  Lipid panel:  Allergies  Allergen Reactions   Bee Venom Swelling    Doesn't require Epi-Pen    Current Outpatient Medications  Medication Sig Dispense Refill   Acetaminophen Childrens 160 MG/5ML SOLN Take by mouth.     HYDROmorphone (DILAUDID) 4 MG tablet Take 4 mg by mouth every 4 (four) hours as needed for severe pain. Take 3 tablets every 4 hours for pain     metFORMIN (GLUCOPHAGE) 1000 MG tablet Take 1,000 mg by mouth 2 (two) times daily.     tiZANidine (ZANAFLEX) 4 MG tablet Take 4 mg by mouth at bedtime.     FLUoxetine (PROZAC) 20 MG capsule Take 20 mg by mouth daily. (Patient not taking: Reported on 02/17/2021)     ibuprofen (ADVIL) 800 MG tablet Take 800 mg by mouth every 8 (eight) hours as needed. (Patient not taking: Reported on 02/17/2021)     lisinopril (ZESTRIL) 5 MG tablet Take 5 mg by mouth daily. (Patient not taking: Reported on 02/03/2021)     OLANZapine (ZYPREXA) 10 MG tablet Take 0.5-1 tablets (5-10 mg total) by mouth at bedtime as needed (nausea). (Patient not taking: Reported on 05/25/2021) 90 tablet 2  omeprazole (PRILOSEC) 40 MG capsule Take 1 capsule by mouth daily. (Patient not taking: Reported on 02/03/2021)     ondansetron (ZOFRAN) 8 MG tablet Take 1 tablet (8 mg total) by mouth 2 (two) times daily as needed for refractory nausea / vomiting. Start on day 3 after chemo. (Patient not taking: Reported on 05/25/2021) 60 tablet 1   ondansetron (ZOFRAN-ODT) 8 MG disintegrating tablet Take 1 tablet (8 mg total) by mouth every 8 (eight) hours as needed for nausea  or vomiting. (Patient not taking: Reported on 05/25/2021) 20 tablet 1   oxyCODONE-acetaminophen (PERCOCET) 10-325 MG tablet Take 1 tablet by mouth every 4 (four) hours as needed. (Patient not taking: Reported on 05/25/2021)     simvastatin (ZOCOR) 10 MG tablet Take 10 mg by mouth daily. (Patient not taking: Reported on 02/03/2021)     No current facility-administered medications for this visit.    OBJECTIVE: Vitals:   05/25/21 1055  BP: 128/89  Pulse: 92  Resp: 18  Temp: 98.6 F (37 C)  SpO2: 100%      Body mass index is 26.88 kg/m.    ECOG FS:0 - Asymptomatic  General: Well-developed, well-nourished, no acute distress. Eyes: Pink conjunctiva, anicteric sclera. HEENT: Normocephalic, moist mucous membranes. Lungs: No audible wheezing or coughing. Heart: Regular rate and rhythm. Abdomen: Soft, nontender, no obvious distention.  G-tube in place. Musculoskeletal: No edema, cyanosis, or clubbing. Neuro: Alert, answering all questions appropriately. Cranial nerves grossly intact. Skin: No rashes or petechiae noted. Psych: Normal affect.   LAB RESULTS:  Lab Results  Component Value Date   NA 140 02/17/2021   K 4.5 02/17/2021   CL 103 02/17/2021   CO2 27 02/17/2021   GLUCOSE 132 (H) 02/17/2021   BUN 13 02/17/2021   CREATININE 0.68 02/17/2021   CALCIUM 9.2 02/17/2021   PROT 6.3 (L) 02/17/2021   ALBUMIN 3.7 02/17/2021   AST 17 02/17/2021   ALT 11 02/17/2021   ALKPHOS 56 02/17/2021   BILITOT 1.2 02/17/2021   GFRNONAA >60 02/17/2021    Lab Results  Component Value Date   WBC 3.7 (L) 02/17/2021   NEUTROABS 2.8 02/17/2021   HGB 11.4 (L) 02/17/2021   HCT 32.7 (L) 02/17/2021   MCV 90.3 02/17/2021   PLT 162 02/17/2021     STUDIES: No results found.  ASSESSMENT: Pathologic stage IIIa esophageal adenocarcinoma.  PLAN:    1.  Pathologic stage IIIa esophageal adenocarcinoma: EGD and biopsy completed in [place] confirmed adenocarcinoma. EUS and CT scan confirmed stage  of disease.  PET scan results from December 31, 2020 reviewed independently with hypermetabolic distal esophageal mass, without local regional lymphadenopathy.  Patient completed neoadjuvant concurrent chemotherapy and XRT on February 17, 2021.  He subsequently under went esophagectomy at [facility] on April 28, 2021.  Although he is staged at pathologic stage IIIa, patient had no evidence of residual tumor at his primary site and only 1 lymph node positive.  No further intervention is needed.  Continue follow-up with surgical oncology at [facility].  Return to clinic first week of February with repeat imaging and further evaluation.   2.  Back pain: Chronic and unchanged.  Patient is now being managed by [facility] pain clinic. 3.  Weight loss: Improved.  Patient is no longer using his G-tube.   4.  Weakness/fatigue: Resolved.  I spent a total of 30 minutes reviewing chart data, face-to-face evaluation with the patient, counseling and coordination of care as detailed above.  Patient expressed understanding  and was in agreement with this plan. He also understands that He can call clinic at any time with any questions, concerns, or complaints.    Cancer Staging  Esophageal adenocarcinoma [facility]) Staging form: Esophagus - Adenocarcinoma, AJCC 8th Edition - Clinical stage from 12/04/2020: Stage III (cT3, cN1, cM0) - Signed by [name], MD on 12/04/2020 Stage prefix: Initial diagnosis - Pathologic stage from 05/25/2021: Stage IIIA (ypT0, pN1, cM0) - Signed by [name], MD on 05/25/2021 Stage prefix: Post-therapy Response to neoadjuvant therapy: Complete response  [name], MD   05/25/2021 12:37 PM

## 2021-05-25 ENCOUNTER — Other Ambulatory Visit: Payer: Self-pay

## 2021-05-25 ENCOUNTER — Inpatient Hospital Stay: Payer: BLUE CROSS/BLUE SHIELD | Attending: Oncology | Admitting: Oncology

## 2021-05-25 VITALS — BP 128/89 | HR 92 | Temp 98.6°F | Resp 18 | Wt 182.0 lb

## 2021-05-25 DIAGNOSIS — G8929 Other chronic pain: Secondary | ICD-10-CM | POA: Diagnosis not present

## 2021-05-25 DIAGNOSIS — Z923 Personal history of irradiation: Secondary | ICD-10-CM | POA: Diagnosis not present

## 2021-05-25 DIAGNOSIS — Z9221 Personal history of antineoplastic chemotherapy: Secondary | ICD-10-CM | POA: Diagnosis not present

## 2021-05-25 DIAGNOSIS — M549 Dorsalgia, unspecified: Secondary | ICD-10-CM | POA: Insufficient documentation

## 2021-05-25 DIAGNOSIS — C159 Malignant neoplasm of esophagus, unspecified: Secondary | ICD-10-CM | POA: Diagnosis present

## 2021-07-30 ENCOUNTER — Ambulatory Visit: Payer: BLUE CROSS/BLUE SHIELD | Attending: Oncology

## 2021-07-30 ENCOUNTER — Encounter: Payer: Self-pay | Admitting: Oncology

## 2021-08-03 NOTE — Progress Notes (Deleted)
[place]  Telephone:(336) [phone] Fax:(336) [phone]  ID: Nicholas Leblanc OB: 02-Feb-1966  MR#: [id number]  OEU#:[id number]  Patient Care Team: Lesly Rubenstein, MD as PCP - General (Gastroenterology) Clent Jacks, RN as Oncology Nurse Navigator  CHIEF COMPLAINT: Pathologic stage IIIa esophageal adenocarcinoma.  INTERVAL HISTORY: Patient returns to clinic today approximately 3 weeks postoperative for further evaluation.  He had a near complete pathologic response with no evidence of the primary tumor and only 1 lymph node positive for disease.  He had been doing tube feeds postoperatively up until yesterday when he switched to 100% by mouth.  He continues to have significant back pain.  He otherwise feels well.  He has no neurologic complaints.  He denies any recent fevers or illnesses.  He denies any chest pain, shortness of breath, cough, or hemoptysis.  He denies any nausea, vomiting, constipation, or diarrhea.  He has no urinary complaints.  Patient offers no further specific complaints today.  REVIEW OF SYSTEMS:   Review of Systems  Constitutional: Negative.  Negative for fever, malaise/fatigue and weight loss.  Respiratory: Negative.  Negative for cough, hemoptysis and shortness of breath.   Cardiovascular: Negative.  Negative for chest pain and leg swelling.  Gastrointestinal: Negative.  Negative for abdominal pain, heartburn and nausea.  Genitourinary: Negative.  Negative for dysuria.  Musculoskeletal:  Positive for back pain.  Skin: Negative.  Negative for rash.  Neurological: Negative.  Negative for dizziness, focal weakness, weakness and headaches.  Psychiatric/Behavioral: Negative.  The patient is not nervous/anxious.    As per HPI. Otherwise, a complete review of systems is negative.  PAST MEDICAL HISTORY: Past Medical History:  Diagnosis Date   Diabetes mellitus without complication ([place])    Hypertension     PAST SURGICAL HISTORY: No past  surgical history on file.  FAMILY HISTORY: No family history on file.  ADVANCED DIRECTIVES (Y/N):  N  HEALTH MAINTENANCE: Social History   Tobacco Use   Smoking status: Every Day    Packs/day: 1.00    Types: Cigarettes   Smokeless tobacco: Never  Vaping Use   Vaping Use: Every day  Substance Use Topics   Alcohol use: Never   Drug use: Yes    Types: Marijuana     Colonoscopy:  PAP:  Bone density:  Lipid panel:  Allergies  Allergen Reactions   Bee Venom Swelling    Doesn't require Epi-Pen    Current Outpatient Medications  Medication Sig Dispense Refill   Acetaminophen Childrens 160 MG/5ML SOLN Take by mouth.     FLUoxetine (PROZAC) 20 MG capsule Take 20 mg by mouth daily. (Patient not taking: Reported on 02/17/2021)     HYDROmorphone (DILAUDID) 4 MG tablet Take 4 mg by mouth every 4 (four) hours as needed for severe pain. Take 3 tablets every 4 hours for pain     ibuprofen (ADVIL) 800 MG tablet Take 800 mg by mouth every 8 (eight) hours as needed. (Patient not taking: Reported on 02/17/2021)     lisinopril (ZESTRIL) 5 MG tablet Take 5 mg by mouth daily. (Patient not taking: Reported on 02/03/2021)     metFORMIN (GLUCOPHAGE) 1000 MG tablet Take 1,000 mg by mouth 2 (two) times daily.     OLANZapine (ZYPREXA) 10 MG tablet Take 0.5-1 tablets (5-10 mg total) by mouth at bedtime as needed (nausea). (Patient not taking: Reported on 05/25/2021) 90 tablet 2   omeprazole (PRILOSEC) 40 MG capsule Take 1 capsule by mouth daily. (Patient not taking:  Reported on 02/03/2021)     ondansetron (ZOFRAN) 8 MG tablet Take 1 tablet (8 mg total) by mouth 2 (two) times daily as needed for refractory nausea / vomiting. Start on day 3 after chemo. (Patient not taking: Reported on 05/25/2021) 60 tablet 1   ondansetron (ZOFRAN-ODT) 8 MG disintegrating tablet Take 1 tablet (8 mg total) by mouth every 8 (eight) hours as needed for nausea or vomiting. (Patient not taking: Reported on 05/25/2021) 20 tablet 1    oxyCODONE-acetaminophen (PERCOCET) 10-325 MG tablet Take 1 tablet by mouth every 4 (four) hours as needed. (Patient not taking: Reported on 05/25/2021)     simvastatin (ZOCOR) 10 MG tablet Take 10 mg by mouth daily. (Patient not taking: Reported on 02/03/2021)     tiZANidine (ZANAFLEX) 4 MG tablet Take 4 mg by mouth at bedtime.     No current facility-administered medications for this visit.    OBJECTIVE: There were no vitals filed for this visit.     There is no height or weight on file to calculate BMI.    ECOG FS:0 - Asymptomatic  General: Well-developed, well-nourished, no acute distress. Eyes: Pink conjunctiva, anicteric sclera. HEENT: Normocephalic, moist mucous membranes. Lungs: No audible wheezing or coughing. Heart: Regular rate and rhythm. Abdomen: Soft, nontender, no obvious distention.  G-tube in place. Musculoskeletal: No edema, cyanosis, or clubbing. Neuro: Alert, answering all questions appropriately. Cranial nerves grossly intact. Skin: No rashes or petechiae noted. Psych: Normal affect.   LAB RESULTS:  Lab Results  Component Value Date   NA 140 02/17/2021   K 4.5 02/17/2021   CL 103 02/17/2021   CO2 27 02/17/2021   GLUCOSE 132 (H) 02/17/2021   BUN 13 02/17/2021   CREATININE 0.68 02/17/2021   CALCIUM 9.2 02/17/2021   PROT 6.3 (L) 02/17/2021   ALBUMIN 3.7 02/17/2021   AST 17 02/17/2021   ALT 11 02/17/2021   ALKPHOS 56 02/17/2021   BILITOT 1.2 02/17/2021   GFRNONAA >60 02/17/2021    Lab Results  Component Value Date   WBC 3.7 (L) 02/17/2021   NEUTROABS 2.8 02/17/2021   HGB 11.4 (L) 02/17/2021   HCT 32.7 (L) 02/17/2021   MCV 90.3 02/17/2021   PLT 162 02/17/2021     STUDIES: No results found.  ASSESSMENT: Pathologic stage IIIa esophageal adenocarcinoma.  PLAN:    1.  Pathologic stage IIIa esophageal adenocarcinoma: EGD and biopsy completed in [place] confirmed adenocarcinoma. EUS and CT scan confirmed stage of disease.  PET scan results  from December 31, 2020 reviewed independently with hypermetabolic distal esophageal mass, without local regional lymphadenopathy.  Patient completed neoadjuvant concurrent chemotherapy and XRT on February 17, 2021.  He subsequently under went esophagectomy at [facility] on April 28, 2021.  Although he is staged at pathologic stage IIIa, patient had no evidence of residual tumor at his primary site and only 1 lymph node positive.  No further intervention is needed.  Continue follow-up with surgical oncology at [facility].  Return to clinic first week of February with repeat imaging and further evaluation.   2.  Back pain: Chronic and unchanged.  Patient is now being managed by [facility] pain clinic. 3.  Weight loss: Improved.  Patient is no longer using his G-tube.   4.  Weakness/fatigue: Resolved.  I spent a total of 30 minutes reviewing chart data, face-to-face evaluation with the patient, counseling and coordination of care as detailed above.  Patient expressed understanding and was in agreement with this plan. He also  understands that He can call clinic at any time with any questions, concerns, or complaints.    Cancer Staging  Esophageal adenocarcinoma [facility]) Staging form: Esophagus - Adenocarcinoma, AJCC 8th Edition - Clinical stage from 12/04/2020: Stage III (cT3, cN1, cM0) - Signed by [name], MD on 12/04/2020 Stage prefix: Initial diagnosis - Pathologic stage from 05/25/2021: Stage IIIA (ypT0, pN1, cM0) - Signed by [name], MD on 05/25/2021 Stage prefix: Post-therapy Response to neoadjuvant therapy: Complete response  [name], MD   08/03/2021 12:57 PM

## 2021-08-04 ENCOUNTER — Inpatient Hospital Stay: Payer: Self-pay | Admitting: Oncology

## 2021-08-04 ENCOUNTER — Inpatient Hospital Stay: Payer: Self-pay | Attending: Oncology

## 2021-08-04 DIAGNOSIS — C159 Malignant neoplasm of esophagus, unspecified: Secondary | ICD-10-CM

## 2021-08-09 NOTE — Progress Notes (Signed)
[place]  [phone] [phone]  ID: Nicholas Leblanc OB: 10-Aug-1965  MR#: [id number]  OIN#:[id number]  Patient Care Team: Lesly Rubenstein, MD as PCP - General (Gastroenterology) Clent Jacks, RN as Oncology Nurse Navigator  I connected with [name] on 08/11/21 at 11:15 AM EST by video enabled telemedicine visit and verified that I am speaking with the correct person using two identifiers.   I discussed the limitations, risks, security and privacy concerns of performing an evaluation and management service by telemedicine and the availability of in-person appointments. I also discussed with the patient that there may be a patient responsible charge related to this service. The patient expressed understanding and agreed to proceed.   Other persons participating in the visit and their role in the encounter: Patient, MD.  Patients location: Home. Providers location: Clinic.  CHIEF COMPLAINT: Pathologic stage IIIa esophageal adenocarcinoma.  INTERVAL HISTORY: Patient agreed to video assisted telemedicine visit for further evaluation.  He has now moved back home to [place] and has obtained a local oncologist there.  He currently feels well and is asymptomatic.  He does admit to occasional dysphagia, but his appetite and weight have remained stable. He has no neurologic complaints.  He denies any recent fevers or illnesses.  He denies any chest pain, shortness of breath, cough, or hemoptysis.  He denies any nausea, vomiting, constipation, or diarrhea.  He has no urinary complaints.  Patient offers no further specific complaints today.  REVIEW OF SYSTEMS:   Review of Systems  Constitutional: Negative.  Negative for fever, malaise/fatigue and weight loss.  Respiratory: Negative.  Negative for cough, hemoptysis and shortness of breath.   Cardiovascular: Negative.  Negative for chest pain and leg swelling.  Gastrointestinal: Negative.   Negative for abdominal pain, heartburn and nausea.  Genitourinary: Negative.  Negative for dysuria.  Musculoskeletal:  Positive for back pain.  Skin: Negative.  Negative for rash.  Neurological: Negative.  Negative for dizziness, focal weakness, weakness and headaches.  Psychiatric/Behavioral: Negative.  The patient is not nervous/anxious.    As per HPI. Otherwise, a complete review of systems is negative.  PAST MEDICAL HISTORY: Past Medical History:  Diagnosis Date   Diabetes mellitus without complication ([place])    Hypertension     PAST SURGICAL HISTORY: No past surgical history on file.  FAMILY HISTORY: No family history on file.  ADVANCED DIRECTIVES (Y/N):  N  HEALTH MAINTENANCE: Social History   Tobacco Use   Smoking status: Every Day    Packs/day: 1.00    Types: Cigarettes   Smokeless tobacco: Never  Vaping Use   Vaping Use: Every day  Substance Use Topics   Alcohol use: Never   Drug use: Yes    Types: Marijuana     Colonoscopy:  PAP:  Bone density:  Lipid panel:  Allergies  Allergen Reactions   Bee Venom Swelling    Doesn't require Epi-Pen    Current Outpatient Medications  Medication Sig Dispense Refill   Acetaminophen Childrens 160 MG/5ML SOLN Take by mouth.     FLUoxetine (PROZAC) 20 MG capsule Take 20 mg by mouth daily. (Patient not taking: Reported on 02/17/2021)     HYDROmorphone (DILAUDID) 4 MG tablet Take 4 mg by mouth every 4 (four) hours as needed for severe pain. Take 3 tablets every 4 hours for pain     ibuprofen (ADVIL) 800 MG tablet Take 800 mg by mouth every 8 (eight) hours as needed. (Patient not taking:  Reported on 02/17/2021)     lisinopril (ZESTRIL) 5 MG tablet Take 5 mg by mouth daily. (Patient not taking: Reported on 02/03/2021)     metFORMIN (GLUCOPHAGE) 1000 MG tablet Take 1,000 mg by mouth 2 (two) times daily.     OLANZapine (ZYPREXA) 10 MG tablet Take 0.5-1 tablets (5-10 mg total) by mouth at bedtime as needed (nausea). (Patient not  taking: Reported on 05/25/2021) 90 tablet 2   omeprazole (PRILOSEC) 40 MG capsule Take 1 capsule by mouth daily. (Patient not taking: Reported on 02/03/2021)     ondansetron (ZOFRAN) 8 MG tablet Take 1 tablet (8 mg total) by mouth 2 (two) times daily as needed for refractory nausea / vomiting. Start on day 3 after chemo. (Patient not taking: Reported on 05/25/2021) 60 tablet 1   ondansetron (ZOFRAN-ODT) 8 MG disintegrating tablet Take 1 tablet (8 mg total) by mouth every 8 (eight) hours as needed for nausea or vomiting. (Patient not taking: Reported on 05/25/2021) 20 tablet 1   oxyCODONE-acetaminophen (PERCOCET) 10-325 MG tablet Take 1 tablet by mouth every 4 (four) hours as needed. (Patient not taking: Reported on 05/25/2021)     simvastatin (ZOCOR) 10 MG tablet Take 10 mg by mouth daily. (Patient not taking: Reported on 02/03/2021)     tiZANidine (ZANAFLEX) 4 MG tablet Take 4 mg by mouth at bedtime.     No current facility-administered medications for this visit.    OBJECTIVE: There were no vitals filed for this visit.     There is no height or weight on file to calculate BMI.    ECOG FS:0 - Asymptomatic  General: Well-developed, well-nourished, no acute distress. HEENT: Normocephalic. Neuro: Alert, answering all questions appropriately. Cranial nerves grossly intact. Psych: Normal affect.  LAB RESULTS:  Lab Results  Component Value Date   NA 140 02/17/2021   K 4.5 02/17/2021   CL 103 02/17/2021   CO2 27 02/17/2021   GLUCOSE 132 (H) 02/17/2021   BUN 13 02/17/2021   CREATININE 0.68 02/17/2021   CALCIUM 9.2 02/17/2021   PROT 6.3 (L) 02/17/2021   ALBUMIN 3.7 02/17/2021   AST 17 02/17/2021   ALT 11 02/17/2021   ALKPHOS 56 02/17/2021   BILITOT 1.2 02/17/2021   GFRNONAA >60 02/17/2021    Lab Results  Component Value Date   WBC 3.7 (L) 02/17/2021   NEUTROABS 2.8 02/17/2021   HGB 11.4 (L) 02/17/2021   HCT 32.7 (L) 02/17/2021   MCV 90.3 02/17/2021   PLT 162 02/17/2021      STUDIES: No results found.  ASSESSMENT: Pathologic stage IIIa esophageal adenocarcinoma.  PLAN:    1.  Pathologic stage IIIa esophageal adenocarcinoma: EGD and biopsy completed in [place] confirmed adenocarcinoma. EUS and CT scan confirmed stage of disease.  PET scan results from December 31, 2020 reviewed independently with hypermetabolic distal esophageal mass, without local regional lymphadenopathy.  Patient completed neoadjuvant concurrent chemotherapy and XRT on February 17, 2021.  He subsequently under went esophagectomy at [facility] on April 28, 2021.  Although he was staged at pathologic stage IIIa, patient had no evidence of residual tumor at his primary site and only 1 lymph node positive.  Repeat imaging at [facility] in November 2022 revealed no evidence of disease.  Patient also reports he had a moe recent CT scan completed in [place] that revealed a "spot" on his lungs, but we do not have these results in hand.  He plans to stay in [place] and continue follow-up with  his local oncologist there.  No further follow-up has been scheduled at this time.   2.  Back pain: Chronic and unchanged.   3.  Weight loss: Resolved. 4.  Weakness/fatigue: Resolved. 5.  Lung nodule: Unclear significance without seeing imaging.  Recommended patient follow-up closely with his local oncologist in [place].  I provided 20 minutes of face-to-face video visit time during this encounter which included chart review, counseling, and coordination of care as documented above.   Patient expressed understanding and was in agreement with this plan. He also understands that He can call clinic at any time with any questions, concerns, or complaints.    Cancer Staging  Esophageal adenocarcinoma [facility]) Staging form: Esophagus - Adenocarcinoma, AJCC 8th Edition - Clinical stage from 12/04/2020: Stage III (cT3, cN1, cM0) - Signed by [name], MD on 12/04/2020 Stage prefix: Initial diagnosis -  Pathologic stage from 05/25/2021: Stage IIIA (ypT0, pN1, cM0) - Signed by [name], MD on 05/25/2021 Stage prefix: Post-therapy Response to neoadjuvant therapy: Complete response  [name], MD   08/11/2021 1:28 PM

## 2021-08-10 ENCOUNTER — Other Ambulatory Visit: Payer: Self-pay | Admitting: Emergency Medicine

## 2021-08-11 ENCOUNTER — Inpatient Hospital Stay (HOSPITAL_BASED_OUTPATIENT_CLINIC_OR_DEPARTMENT_OTHER): Payer: Self-pay | Admitting: Oncology

## 2021-08-11 ENCOUNTER — Other Ambulatory Visit: Payer: Self-pay

## 2021-08-11 DIAGNOSIS — C159 Malignant neoplasm of esophagus, unspecified: Secondary | ICD-10-CM

## 2022-11-24 IMAGING — CT NM PET TUM IMG INITIAL (PI) SKULL BASE T - THIGH
1 of 10 series · 1 of 25 positions shown · non-contrast
Comparison: Chest CT 12/01/2020

CLINICAL DATA: Initial treatment strategy for esophageal cancer.

EXAM:
NUCLEAR MEDICINE PET SKULL BASE TO THIGH
TECHNIQUE: 10.03 mCi F-18 FDG was injected intravenously. Full-ring PET imaging
was performed from the skull base to thigh after the radiotracer. CT
data was obtained and used for attenuation correction and anatomic
localization.
Fasting blood glucose: 150 mg/dl

[Series 3: ct wb 5.0 b30f · axial · 5.0mm · 0.98mm/px · 1 of 329 slices shown]
[im 329/329  brain]
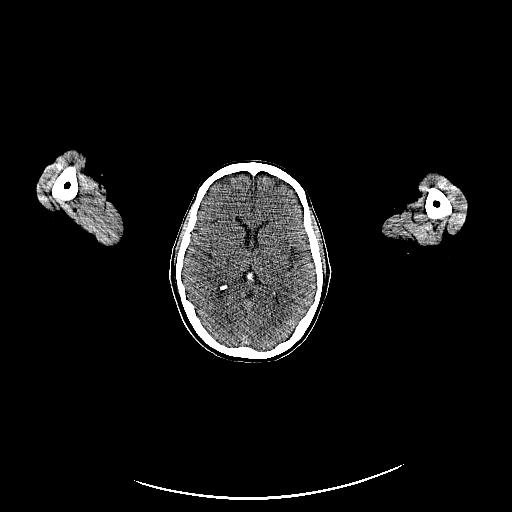

[1 of 25 positions shown; findings below may reference images not displayed]

FINDINGS: Mediastinal blood pool activity: SUV max

Liver activity: SUV max NA

NECK: No hypermetabolic lymph nodes in the neck.

Incidental CT findings: none

CHEST: Distal esophageal mass is hypermetabolic and extends into the
GE junction. SUV max is 22.04.

No paraesophageal lymphadenopathy and no enlarged or hypermetabolic
mediastinal or hilar lymph nodes.

No supraclavicular or axillary adenopathy.

No worrisome pulmonary nodules are identified the CT scan.

Incidental CT findings: none

ABDOMEN/PELVIS: No enlarged or hypermetabolic gastrohepatic
ligament, celiac axis or retroperitoneal lymph nodes. No findings
suspicious for hepatic metastatic disease. The adrenal glands are
normal.

Incidental CT findings: none

SKELETON:

Incidental CT findings: No findings suspicious for osseous
metastatic disease.
IMPRESSION: 1. Hypermetabolic distal esophageal mass extending into the GE
junction consistent with known esophageal cancer.
2. No findings for locoregional hypermetabolic lymphadenopathy or
distant metastatic disease.

## 2023-01-26 DEATH — deceased
